# Patient Record
Sex: Female | Born: 1985 | Hispanic: Yes | Marital: Single | Smoking: Former smoker
Health system: Southern US, Community
[De-identification: ages and names within clinical notes are randomized; demographics above are authoritative.]

## PROBLEM LIST (undated history)

## (undated) ENCOUNTER — Inpatient Hospital Stay (HOSPITAL_COMMUNITY): Payer: Self-pay

## (undated) DIAGNOSIS — O24419 Gestational diabetes mellitus in pregnancy, unspecified control: Secondary | ICD-10-CM

## (undated) DIAGNOSIS — K219 Gastro-esophageal reflux disease without esophagitis: Secondary | ICD-10-CM

## (undated) HISTORY — DX: Gestational diabetes mellitus in pregnancy, unspecified control: O24.419

---
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
## 2004-12-16 ENCOUNTER — Ambulatory Visit (HOSPITAL_COMMUNITY): Admission: RE | Admit: 2004-12-16 | Discharge: 2004-12-16 | Payer: Self-pay | Admitting: *Deleted

## 2004-12-23 ENCOUNTER — Inpatient Hospital Stay (HOSPITAL_COMMUNITY): Admission: AD | Admit: 2004-12-23 | Discharge: 2004-12-23 | Payer: Self-pay | Admitting: *Deleted

## 2005-05-07 ENCOUNTER — Ambulatory Visit (HOSPITAL_COMMUNITY): Admission: RE | Admit: 2005-05-07 | Discharge: 2005-05-07 | Payer: Self-pay | Admitting: Family Medicine

## 2005-05-13 ENCOUNTER — Ambulatory Visit: Payer: Self-pay | Admitting: Obstetrics & Gynecology

## 2005-05-18 ENCOUNTER — Ambulatory Visit: Payer: Self-pay | Admitting: Obstetrics & Gynecology

## 2005-05-18 ENCOUNTER — Inpatient Hospital Stay (HOSPITAL_COMMUNITY): Admission: RE | Admit: 2005-05-18 | Discharge: 2005-05-21 | Payer: Self-pay | Admitting: Obstetrics & Gynecology

## 2005-05-24 ENCOUNTER — Inpatient Hospital Stay (HOSPITAL_COMMUNITY): Admission: AD | Admit: 2005-05-24 | Discharge: 2005-05-24 | Payer: Self-pay | Admitting: *Deleted

## 2011-09-22 ENCOUNTER — Encounter (HOSPITAL_COMMUNITY): Payer: Self-pay | Admitting: *Deleted

## 2011-09-22 ENCOUNTER — Inpatient Hospital Stay (HOSPITAL_COMMUNITY)
Admission: AD | Admit: 2011-09-22 | Discharge: 2011-09-22 | Disposition: A | Discharge status: Home / Self Care | Payer: Self-pay | Source: Ambulatory Visit | Attending: Obstetrics and Gynecology | Admitting: Obstetrics and Gynecology

## 2011-09-22 ENCOUNTER — Inpatient Hospital Stay (HOSPITAL_COMMUNITY): Payer: Self-pay

## 2011-09-22 DIAGNOSIS — O26899 Other specified pregnancy related conditions, unspecified trimester: Secondary | ICD-10-CM

## 2011-09-22 DIAGNOSIS — R109 Unspecified abdominal pain: Secondary | ICD-10-CM | POA: Insufficient documentation

## 2011-09-22 DIAGNOSIS — O99891 Other specified diseases and conditions complicating pregnancy: Secondary | ICD-10-CM | POA: Insufficient documentation

## 2011-09-22 LAB — CBC
HCT: 35.7 % — ABNORMAL LOW (ref 36.0–46.0)
Hemoglobin: 12.1 g/dL (ref 12.0–15.0)
MCH: 32.4 pg (ref 26.0–34.0)
MCHC: 33.9 g/dL (ref 30.0–36.0)
MCV: 95.5 fL (ref 78.0–100.0)

## 2011-09-22 LAB — WET PREP, GENITAL

## 2011-09-22 NOTE — MAU Note (Signed)
Patient states she has been having abdominal pain around the belly button for "some weeks" that comes goes. Feels like something is moving. Denies any bleeding with a little discharge. A lot of nausea for about one month.

## 2011-09-23 LAB — GC/CHLAMYDIA PROBE AMP, GENITAL: GC Probe Amp, Genital: NEGATIVE

## 2012-01-12 ENCOUNTER — Encounter: Payer: Self-pay | Admitting: Obstetrics and Gynecology

## 2012-01-18 LAB — OB RESULTS CONSOLE RPR: RPR: NONREACTIVE

## 2012-01-18 LAB — OB RESULTS CONSOLE HIV ANTIBODY (ROUTINE TESTING): HIV: NONREACTIVE

## 2012-01-18 LAB — SICKLE CELL SCREEN: Sickle Cell Screen: NORMAL

## 2012-01-18 LAB — OB RESULTS CONSOLE VARICELLA ZOSTER ANTIBODY, IGG: VARICELLA IGG: IMMUNE

## 2012-03-09 ENCOUNTER — Other Ambulatory Visit: Payer: Self-pay | Admitting: Obstetrics & Gynecology

## 2012-05-02 ENCOUNTER — Encounter (HOSPITAL_COMMUNITY): Payer: Self-pay

## 2012-05-03 ENCOUNTER — Encounter (HOSPITAL_COMMUNITY)
Admission: RE | Admit: 2012-05-03 | Discharge: 2012-05-03 | Disposition: A | Discharge status: Home / Self Care | Payer: Medicaid Other | Source: Ambulatory Visit | Attending: Obstetrics & Gynecology | Admitting: Obstetrics & Gynecology

## 2012-05-03 ENCOUNTER — Encounter (HOSPITAL_COMMUNITY): Payer: Self-pay

## 2012-05-03 LAB — CBC
Hemoglobin: 13.5 g/dL (ref 12.0–15.0)
MCH: 32.3 pg (ref 26.0–34.0)
MCV: 95 fL (ref 78.0–100.0)
RBC: 4.18 MIL/uL (ref 3.87–5.11)

## 2012-05-03 LAB — TYPE AND SCREEN: Antibody Screen: NEGATIVE

## 2012-05-03 LAB — ABO/RH: ABO/RH(D): O POS

## 2012-05-03 LAB — RPR: RPR Ser Ql: NONREACTIVE

## 2012-05-03 NOTE — Patient Instructions (Addendum)
05/06/1418 Kristina Campbell  05/03/2012   Your procedure is scheduled on:  05/05/12  Enter through the Main Entrance of [facility] at 8 AM.  Pick up the phone at the desk and dial [phone].   Call this number if you have problems the morning of surgery: [phone]   Remember:   Do not eat food:After Midnight.  Do not drink clear liquids: After Midnight.  Take these medicines the morning of surgery with A SIP OF WATER: NA   Do not wear jewelry, make-up or nail polish.  Do not wear lotions, powders, or perfumes. You may wear deodorant.  Do not shave 48 hours prior to surgery.  Do not bring valuables to the hospital.  Contacts, dentures or bridgework may not be worn into surgery.  Leave suitcase in the car. After surgery it may be brought to your room.  For patients admitted to the hospital, checkout time is 11:00 AM the day of discharge.   Patients discharged the day of surgery will not be allowed to drive home.  Name and phone number of your driver: NA  Special Instructions: Shower using CHG 2 nights before surgery and the night before surgery.  If you shower the day of surgery use CHG.  Use special wash - you have one bottle of CHG for all showers.  You should use approximately 1/3 of the bottle for each shower.   Please read over the following fact sheets that you were given: Surgical Site Infection Prevention            20 Kristina Campbell  05/03/2012   Your procedure is scheduled on:  05/05/12  Enter through the Main Entrance of [facility] at 8 AM.  Pick up the phone at the desk and dial [phone].   Call this number if you have problems the morning of surgery: [phone]   Remember:   Do not eat food:After Midnight.  Do not drink clear liquids: After Midnight.  Take these medicines the morning of surgery with A SIP OF WATER: NA   Do not wear jewelry, make-up or nail polish.  Do not wear lotions, powders, or perfumes. You may wear deodorant.  Do  not shave 48 hours prior to surgery.  Do not bring valuables to the hospital.  Contacts, dentures or bridgework may not be worn into surgery.  Leave suitcase in the car. After surgery it may be brought to your room.  For patients admitted to the hospital, checkout time is 11:00 AM the day of discharge.   Patients discharged the day of surgery will not be allowed to drive home.  Name and phone number of your driver: NA  Special Instructions: Shower using CHG 2 nights before surgery and the night before surgery.  If you shower the day of surgery use CHG.  Use special wash - you have one bottle of CHG for all showers.  You should use approximately 1/3 of the bottle for each shower.   Please read over the following fact sheets that you were given: Surgical Site Infection Prevention

## 2012-05-05 ENCOUNTER — Encounter (HOSPITAL_COMMUNITY): Payer: Self-pay

## 2012-05-05 ENCOUNTER — Inpatient Hospital Stay (HOSPITAL_COMMUNITY): Payer: Medicaid Other

## 2012-05-05 ENCOUNTER — Encounter (HOSPITAL_COMMUNITY)
Admission: RE | Disposition: A | Discharge status: Home / Self Care | Payer: Self-pay | Source: Ambulatory Visit | Attending: Obstetrics & Gynecology

## 2012-05-05 ENCOUNTER — Encounter (HOSPITAL_COMMUNITY): Payer: Self-pay | Admitting: General Surgery

## 2012-05-05 ENCOUNTER — Inpatient Hospital Stay (HOSPITAL_COMMUNITY)
Admission: RE | Admit: 2012-05-05 | Discharge: 2012-05-07 | DRG: 766 | Disposition: A | Discharge status: Home / Self Care | Payer: Medicaid Other | Source: Ambulatory Visit | Attending: Obstetrics & Gynecology | Admitting: Obstetrics & Gynecology

## 2012-05-05 DIAGNOSIS — O9989 Other specified diseases and conditions complicating pregnancy, childbirth and the puerperium: Secondary | ICD-10-CM

## 2012-05-05 DIAGNOSIS — Z01818 Encounter for other preprocedural examination: Secondary | ICD-10-CM

## 2012-05-05 DIAGNOSIS — Z98891 History of uterine scar from previous surgery: Secondary | ICD-10-CM

## 2012-05-05 DIAGNOSIS — O99892 Other specified diseases and conditions complicating childbirth: Secondary | ICD-10-CM | POA: Diagnosis present

## 2012-05-05 DIAGNOSIS — O34219 Maternal care for unspecified type scar from previous cesarean delivery: Principal | ICD-10-CM

## 2012-05-05 DIAGNOSIS — K219 Gastro-esophageal reflux disease without esophagitis: Secondary | ICD-10-CM

## 2012-05-05 DIAGNOSIS — Z01812 Encounter for preprocedural laboratory examination: Secondary | ICD-10-CM

## 2012-05-05 HISTORY — DX: Gastro-esophageal reflux disease without esophagitis: K21.9

## 2012-05-05 LAB — TYPE AND SCREEN

## 2012-05-05 SURGERY — Surgical Case
Anesthesia: Spinal | Site: Abdomen | Wound class: Clean Contaminated

## 2012-05-05 MED ORDER — NALOXONE HCL 0.4 MG/ML IJ SOLN: 0.4000 mg | INTRAMUSCULAR | Status: DC | PRN

## 2012-05-05 MED ORDER — ONDANSETRON HCL 4 MG/2ML IJ SOLN: 4.0000 mg | Freq: Three times a day (TID) | INTRAMUSCULAR | Status: DC | PRN

## 2012-05-05 MED ORDER — MEPERIDINE HCL 25 MG/ML IJ SOLN: 6.2500 mg | INTRAMUSCULAR | Status: DC | PRN

## 2012-05-05 MED ORDER — DIPHENHYDRAMINE HCL 50 MG/ML IJ SOLN: 12.5000 mg | INTRAMUSCULAR | Status: DC | PRN

## 2012-05-05 MED ORDER — ZOLPIDEM TARTRATE 5 MG PO TABS: 5.0000 mg | ORAL_TABLET | Freq: Every evening | ORAL | Status: DC | PRN

## 2012-05-05 MED ORDER — CEFAZOLIN SODIUM-DEXTROSE 2-3 GM-% IV SOLR
2.0000 g | INTRAVENOUS | Status: AC
  Administered 2012-05-05: 2 g via INTRAVENOUS

## 2012-05-05 MED ORDER — KETOROLAC TROMETHAMINE 30 MG/ML IJ SOLN: 30.0000 mg | Freq: Four times a day (QID) | INTRAMUSCULAR | Status: AC | PRN

## 2012-05-05 MED ORDER — IBUPROFEN 600 MG PO TABS
600.0000 mg | ORAL_TABLET | Freq: Four times a day (QID) | ORAL | Status: DC | PRN
  Filled 2012-05-05 (×3): qty 1

## 2012-05-05 MED ORDER — DIPHENHYDRAMINE HCL 25 MG PO CAPS
25.0000 mg | ORAL_CAPSULE | ORAL | Status: DC | PRN
  Filled 2012-05-05: qty 1

## 2012-05-05 MED ORDER — LACTATED RINGERS IV SOLN
INTRAVENOUS | Status: DC
  Administered 2012-05-05 (×3): via INTRAVENOUS

## 2012-05-05 MED ORDER — SIMETHICONE 80 MG PO CHEW
80.0000 mg | CHEWABLE_TABLET | Freq: Three times a day (TID) | ORAL | Status: DC
  Administered 2012-05-05 – 2012-05-07 (×5): 80 mg via ORAL

## 2012-05-05 MED ORDER — OXYTOCIN 10 UNIT/ML IJ SOLN
40.0000 [IU] | INTRAVENOUS | Status: DC | PRN
  Administered 2012-05-05: 40 [IU] via INTRAVENOUS

## 2012-05-05 MED ORDER — OXYTOCIN 40 UNITS IN LACTATED RINGERS INFUSION - SIMPLE MED: 62.5000 mL/h | INTRAVENOUS | Status: AC

## 2012-05-05 MED ORDER — ONDANSETRON HCL 4 MG/2ML IJ SOLN
INTRAMUSCULAR | Status: AC
  Filled 2012-05-05: qty 2

## 2012-05-05 MED ORDER — WITCH HAZEL-GLYCERIN EX PADS: 1.0000 "application " | MEDICATED_PAD | CUTANEOUS | Status: DC | PRN

## 2012-05-05 MED ORDER — SCOPOLAMINE 1 MG/3DAYS TD PT72: 1.0000 | MEDICATED_PATCH | Freq: Once | TRANSDERMAL | Status: DC

## 2012-05-05 MED ORDER — LACTATED RINGERS IV SOLN
INTRAVENOUS | Status: DC
  Administered 2012-05-05 – 2012-05-06 (×2): via INTRAVENOUS

## 2012-05-05 MED ORDER — DIPHENHYDRAMINE HCL 25 MG PO CAPS: 25.0000 mg | ORAL_CAPSULE | Freq: Four times a day (QID) | ORAL | Status: DC | PRN

## 2012-05-05 MED ORDER — MORPHINE SULFATE (PF) 0.5 MG/ML IJ SOLN
INTRAMUSCULAR | Status: DC | PRN
  Administered 2012-05-05: .15 mg via INTRATHECAL

## 2012-05-05 MED ORDER — IBUPROFEN 600 MG PO TABS
600.0000 mg | ORAL_TABLET | Freq: Four times a day (QID) | ORAL | Status: DC
  Administered 2012-05-06 – 2012-05-07 (×6): 600 mg via ORAL
  Filled 2012-05-05 (×2): qty 1

## 2012-05-05 MED ORDER — PHENYLEPHRINE HCL 10 MG/ML IJ SOLN
INTRAMUSCULAR | Status: DC | PRN
  Administered 2012-05-05: 40 ug via INTRAVENOUS
  Administered 2012-05-05 (×2): 80 ug via INTRAVENOUS
  Administered 2012-05-05: 40 ug via INTRAVENOUS
  Administered 2012-05-05: 80 ug via INTRAVENOUS
  Administered 2012-05-05 (×2): 40 ug via INTRAVENOUS

## 2012-05-05 MED ORDER — NALBUPHINE HCL 10 MG/ML IJ SOLN
5.0000 mg | INTRAMUSCULAR | Status: DC | PRN
  Filled 2012-05-05: qty 1

## 2012-05-05 MED ORDER — DIPHENHYDRAMINE HCL 50 MG/ML IJ SOLN
INTRAMUSCULAR | Status: AC
  Administered 2012-05-05: 12.5 mg via INTRAVENOUS
  Filled 2012-05-05: qty 1

## 2012-05-05 MED ORDER — NALOXONE HCL 1 MG/ML IJ SOLN
1.0000 ug/kg/h | INTRAVENOUS | Status: DC | PRN
  Filled 2012-05-05: qty 2

## 2012-05-05 MED ORDER — SODIUM CHLORIDE 0.9 % IJ SOLN: 3.0000 mL | INTRAMUSCULAR | Status: DC | PRN

## 2012-05-05 MED ORDER — SIMETHICONE 80 MG PO CHEW: 80.0000 mg | CHEWABLE_TABLET | ORAL | Status: DC | PRN

## 2012-05-05 MED ORDER — SENNOSIDES-DOCUSATE SODIUM 8.6-50 MG PO TABS
2.0000 | ORAL_TABLET | Freq: Every day | ORAL | Status: DC
  Administered 2012-05-05 – 2012-05-06 (×2): 2 via ORAL

## 2012-05-05 MED ORDER — SCOPOLAMINE 1 MG/3DAYS TD PT72
1.0000 | MEDICATED_PATCH | Freq: Once | TRANSDERMAL | Status: DC
  Filled 2012-05-05: qty 1

## 2012-05-05 MED ORDER — TETANUS-DIPHTH-ACELL PERTUSSIS 5-2.5-18.5 LF-MCG/0.5 IM SUSP
0.5000 mL | Freq: Once | INTRAMUSCULAR | Status: AC
  Administered 2012-05-06: 0.5 mL via INTRAMUSCULAR

## 2012-05-05 MED ORDER — KETOROLAC TROMETHAMINE 60 MG/2ML IM SOLN: 60.0000 mg | Freq: Once | INTRAMUSCULAR | Status: AC | PRN

## 2012-05-05 MED ORDER — ONDANSETRON HCL 4 MG PO TABS: 4.0000 mg | ORAL_TABLET | ORAL | Status: DC | PRN

## 2012-05-05 MED ORDER — FENTANYL CITRATE 0.05 MG/ML IJ SOLN
INTRAMUSCULAR | Status: AC
  Filled 2012-05-05: qty 2

## 2012-05-05 MED ORDER — PRENATAL MULTIVITAMIN CH
1.0000 | ORAL_TABLET | Freq: Every day | ORAL | Status: DC
  Administered 2012-05-06: 1 via ORAL
  Filled 2012-05-05: qty 1

## 2012-05-05 MED ORDER — METOCLOPRAMIDE HCL 5 MG/ML IJ SOLN: 10.0000 mg | Freq: Three times a day (TID) | INTRAMUSCULAR | Status: DC | PRN

## 2012-05-05 MED ORDER — 0.9 % SODIUM CHLORIDE (POUR BTL) OPTIME
TOPICAL | Status: DC | PRN
  Administered 2012-05-05: 1000 mL

## 2012-05-05 MED ORDER — PHENYLEPHRINE 40 MCG/ML (10ML) SYRINGE FOR IV PUSH (FOR BLOOD PRESSURE SUPPORT)
PREFILLED_SYRINGE | INTRAVENOUS | Status: AC
  Filled 2012-05-05: qty 5

## 2012-05-05 MED ORDER — MENTHOL 3 MG MT LOZG: 1.0000 | LOZENGE | OROMUCOSAL | Status: DC | PRN

## 2012-05-05 MED ORDER — LANOLIN HYDROUS EX OINT: 1.0000 "application " | TOPICAL_OINTMENT | CUTANEOUS | Status: DC | PRN

## 2012-05-05 MED ORDER — OXYCODONE-ACETAMINOPHEN 5-325 MG PO TABS
1.0000 | ORAL_TABLET | ORAL | Status: DC | PRN
  Administered 2012-05-06: 2 via ORAL
  Administered 2012-05-07: 1 via ORAL
  Filled 2012-05-05: qty 1
  Filled 2012-05-05: qty 2

## 2012-05-05 MED ORDER — LACTATED RINGERS IV SOLN: INTRAVENOUS | Status: DC

## 2012-05-05 MED ORDER — EPHEDRINE 5 MG/ML INJ
INTRAVENOUS | Status: AC
  Filled 2012-05-05: qty 10

## 2012-05-05 MED ORDER — BUPIVACAINE IN DEXTROSE 0.75-8.25 % IT SOLN
INTRATHECAL | Status: DC | PRN
  Administered 2012-05-05: 1.4 mL via INTRATHECAL

## 2012-05-05 MED ORDER — KETOROLAC TROMETHAMINE 60 MG/2ML IM SOLN
INTRAMUSCULAR | Status: AC
  Administered 2012-05-05: 60 mg via INTRAMUSCULAR
  Filled 2012-05-05: qty 2

## 2012-05-05 MED ORDER — MORPHINE SULFATE 0.5 MG/ML IJ SOLN
INTRAMUSCULAR | Status: AC
  Filled 2012-05-05: qty 10

## 2012-05-05 MED ORDER — FENTANYL CITRATE 0.05 MG/ML IJ SOLN
INTRAMUSCULAR | Status: DC | PRN
  Administered 2012-05-05: 25 ug via INTRATHECAL

## 2012-05-05 MED ORDER — ONDANSETRON HCL 4 MG/2ML IJ SOLN: 4.0000 mg | INTRAMUSCULAR | Status: DC | PRN

## 2012-05-05 MED ORDER — EPHEDRINE SULFATE 50 MG/ML IJ SOLN
INTRAMUSCULAR | Status: DC | PRN
  Administered 2012-05-05: 5 mg via INTRAVENOUS
  Administered 2012-05-05: 10 mg via INTRAVENOUS
  Administered 2012-05-05: 5 mg via INTRAVENOUS
  Administered 2012-05-05: 10 mg via INTRAVENOUS

## 2012-05-05 MED ORDER — NALBUPHINE HCL 10 MG/ML IJ SOLN
5.0000 mg | INTRAMUSCULAR | Status: DC | PRN
Start: 2012-05-05 — End: 2012-05-07
  Filled 2012-05-05: qty 1

## 2012-05-05 MED ORDER — SCOPOLAMINE 1 MG/3DAYS TD PT72
MEDICATED_PATCH | TRANSDERMAL | Status: AC
  Administered 2012-05-05: 1.5 mg via TRANSDERMAL
  Filled 2012-05-05: qty 1

## 2012-05-05 MED ORDER — ONDANSETRON HCL 4 MG/2ML IJ SOLN
INTRAMUSCULAR | Status: DC | PRN
  Administered 2012-05-05: 4 mg via INTRAVENOUS

## 2012-05-05 MED ORDER — DIBUCAINE 1 % RE OINT: 1.0000 "application " | TOPICAL_OINTMENT | RECTAL | Status: DC | PRN

## 2012-05-05 MED ORDER — MISOPROSTOL 200 MCG PO TABS
ORAL_TABLET | ORAL | Status: AC
  Filled 2012-05-05: qty 1

## 2012-05-05 MED ORDER — OXYTOCIN 10 UNIT/ML IJ SOLN
INTRAMUSCULAR | Status: AC
  Filled 2012-05-05: qty 4

## 2012-05-05 MED ORDER — MORPHINE SULFATE (PF) 0.5 MG/ML IJ SOLN: INTRAMUSCULAR | Status: DC | PRN

## 2012-05-05 MED ORDER — DIPHENHYDRAMINE HCL 50 MG/ML IJ SOLN: 25.0000 mg | INTRAMUSCULAR | Status: DC | PRN

## 2012-05-05 SURGICAL SUPPLY — 30 items
BARRIER ADHS 3X4 INTERCEED (GAUZE/BANDAGES/DRESSINGS) IMPLANT
BRR ADH 4X3 ABS CNTRL BYND (GAUZE/BANDAGES/DRESSINGS)
CLOTH BEACON ORANGE TIMEOUT ST (SAFETY) ×2 IMPLANT
DRAPE LG THREE QUARTER DISP (DRAPES) ×2 IMPLANT
DRSG OPSITE POSTOP 4X10 (GAUZE/BANDAGES/DRESSINGS) ×2 IMPLANT
DURAPREP 26ML APPLICATOR (WOUND CARE) ×2 IMPLANT
ELECT REM PT RETURN 9FT ADLT (ELECTROSURGICAL) ×2
ELECTRODE REM PT RTRN 9FT ADLT (ELECTROSURGICAL) ×1 IMPLANT
EXTRACTOR VACUUM KIWI (MISCELLANEOUS) IMPLANT
GLOVE BIO SURGEON STRL SZ 6.5 (GLOVE) ×3 IMPLANT
GLOVE BIOGEL PI IND STRL 7.0 (GLOVE) ×1 IMPLANT
GLOVE BIOGEL PI INDICATOR 7.0 (GLOVE) ×3
GOWN STRL REIN XL XLG (GOWN DISPOSABLE) ×6 IMPLANT
KIT ABG SYR 3ML LUER SLIP (SYRINGE) IMPLANT
NEEDLE HYPO 25X5/8 SAFETYGLIDE (NEEDLE) IMPLANT
NS IRRIG 1000ML POUR BTL (IV SOLUTION) ×2 IMPLANT
PACK C SECTION WH (CUSTOM PROCEDURE TRAY) ×2 IMPLANT
PAD OB MATERNITY 4.3X12.25 (PERSONAL CARE ITEMS) ×2 IMPLANT
RETAINER VISCERAL (MISCELLANEOUS) ×2 IMPLANT
RETRACTOR WND ALEXIS 25 LRG (MISCELLANEOUS) ×1 IMPLANT
RTRCTR WOUND ALEXIS 25CM LRG (MISCELLANEOUS) ×2
SLEEVE SCD COMPRESS KNEE LRG (MISCELLANEOUS) IMPLANT
SLEEVE SCD COMPRESS KNEE MED (MISCELLANEOUS) IMPLANT
SUT VIC AB 0 CT1 36 (SUTURE) ×14 IMPLANT
SUT VIC AB 2-0 CT1 27 (SUTURE) ×2
SUT VIC AB 2-0 CT1 TAPERPNT 27 (SUTURE) ×1 IMPLANT
SUT VIC AB 4-0 PS2 27 (SUTURE) ×2 IMPLANT
TOWEL OR 17X24 6PK STRL BLUE (TOWEL DISPOSABLE) ×6 IMPLANT
TRAY FOLEY CATH 14FR (SET/KITS/TRAYS/PACK) ×2 IMPLANT
WATER STERILE IRR 1000ML POUR (IV SOLUTION) ×1 IMPLANT

## 2012-05-05 NOTE — Anesthesia Procedure Notes (Signed)

## 2012-05-05 NOTE — Progress Notes (Signed)
UR chart review completed.  

## 2012-05-05 NOTE — H&P (Signed)
Kristina Campbell is a 27 y.o. female presenting for repeat cesarean section, 3 previous.. Maternal Medical History:  Reason for admission: Repeat cesarean section  Fetal activity: Perceived fetal activity is normal.   Last perceived fetal movement was within the past hour.    Prenatal complications: No bleeding or pre-eclampsia.   Prenatal Complications - Diabetes: none.    OB History   Grav Para Term Preterm Abortions TAB SAB Ect Mult Living   4 3 3  0 0 0 0 0 0 3     Past Medical History  Diagnosis Date  . Medical history non-contributory   . GERD (gastroesophageal reflux disease)     pt. states she has reflux and takes Zantac-last taken yesterday   Past Surgical History  Procedure Laterality Date  . Cesarean section    . Cesarean section      x3   Family History: family history is not on file. Social History:  reports that she has quit smoking. She does not have any smokeless tobacco history on file. She reports that she does not drink alcohol. Her drug history is not on file.   Prenatal Transfer Tool  Maternal Diabetes: No Genetic Screening: Declined Maternal Ultrasounds/Referrals: Normal Fetal Ultrasounds or other Referrals:  None Maternal Substance Abuse:  No Significant Maternal Medications:  None Significant Maternal Lab Results:  None Other Comments:  None  Review of Systems  Constitutional: Negative.   Respiratory: Negative for cough.   Gastrointestinal: Negative for vomiting.  Genitourinary: Negative for urgency.      Blood pressure 124/63, pulse 89, temperature 97.9 F (36.6 C), temperature source Oral, last menstrual period 08/06/2011. Maternal Exam:  Abdomen: Surgical scars: low transverse.   Fundal height is c/w dates.   Estimated fetal weight is 7 lb.   Fetal presentation: vertex  Introitus: not evaluated.   Cervix: not evaluated.   Physical Exam  Nursing note and vitals reviewed. Constitutional: She is oriented to person,  place, and time. She appears well-developed and well-nourished. No distress.  Neck: Normal range of motion.  Cardiovascular: Normal rate, regular rhythm and normal heart sounds.   Respiratory: Effort normal and breath sounds normal. No respiratory distress.  GI: Soft. There is no tenderness.  Musculoskeletal: She exhibits no edema and no tenderness.  Neurological: She is alert and oriented to person, place, and time.  Skin: Skin is warm and dry.  Psychiatric: She has a normal mood and affect. Her behavior is normal.    Prenatal labs: ABO, Rh: --/--/O POS (03/14 0810) Antibody: NEG (03/14 0810) Rubella: Immune (11/26 1048) RPR: NON REACTIVE (03/12 1050)  HBsAg: Negative (11/26 1048)  HIV: Non-reactive (11/26 1048)  GBS:    CBC    Component Value Date/Time   WBC 7.2 05/03/2012 1050   RBC 4.18 05/03/2012 1050   HGB 13.5 05/03/2012 1050   HCT 39.7 05/03/2012 1050   PLT 155 05/03/2012 1050   MCV 95.0 05/03/2012 1050   MCH 32.3 05/03/2012 1050   MCHC 34.0 05/03/2012 1050   RDW 14.2 05/03/2012 1050     Assessment/Plan: 3 previous cesarean sections, for repeat at 39 weeks. With interpreter patient was counseled and her questions were answered. The risks of surgery and anesthesia were discussed including pain, bleeding, infection, visceral organ damage. Consent signed.  Earlin Sweeden 05/05/2012, 9:23 AM

## 2012-05-05 NOTE — Anesthesia Postprocedure Evaluation (Signed)
  Anesthesia Post-op Note  Patient: Kristina Campbell  Procedure(s) Performed: Procedure(s) with comments: CESAREAN SECTION (N/A) - Repeat  Patient Location: Mother/Baby  Anesthesia Type:Spinal  Level of Consciousness: awake, alert  and oriented  Airway and Oxygen Therapy: Patient Spontanous Breathing  Post-op Pain: mild  Post-op Assessment: Patient's Cardiovascular Status Stable, Respiratory Function Stable, Patent Airway, No signs of Nausea or vomiting, Adequate PO intake and Pain level controlled  Post-op Vital Signs: stable  Complications: No apparent anesthesia complications

## 2012-05-05 NOTE — Transfer of Care (Signed)
Immediate Anesthesia Transfer of Care Note  Patient: Kristina Campbell  Procedure(s) Performed: Procedure(s) with comments: CESAREAN SECTION (N/A) - Repeat  Patient Location: PACU  Anesthesia Type:Spinal  Level of Consciousness: awake, alert  and oriented  Airway & Oxygen Therapy: Patient Spontanous Breathing  Post-op Assessment: Report given to PACU RN and Post -op Vital signs reviewed and stable  Post vital signs: Reviewed and stable  Complications: No apparent anesthesia complications

## 2012-05-05 NOTE — Anesthesia Postprocedure Evaluation (Signed)
  Anesthesia Post-op Note  Patient: Renie Ora Dominguez-Arteaga  Procedure(s) Performed: Procedure(s) with comments: CESAREAN SECTION (N/A) - Repeat  Patient Location: PACU  Anesthesia Type:Spinal  Level of Consciousness: awake, alert  and oriented  Airway and Oxygen Therapy: Patient Spontanous Breathing  Post-op Pain: none  Post-op Assessment: Post-op Vital signs reviewed, Patient's Cardiovascular Status Stable, Respiratory Function Stable, Patent Airway, No signs of Nausea or vomiting, Pain level controlled, No headache, No backache and No residual numbness  Post-op Vital Signs: Reviewed and stable  Complications: No apparent anesthesia complications

## 2012-05-05 NOTE — Anesthesia Preprocedure Evaluation (Signed)

## 2012-05-05 NOTE — Op Note (Signed)
Procedure: Repeat cesarean section Preoperative diagnosis: Intrauterine pregnancy [redacted] weeks gestation with 3 previous cesarean sections Postoperative diagnosis: Intrauterine pregnancy delivered, live born female infant Surgeon: Dr. Scheryl Darter Anesthesia: Spinal by Dr. Cristela Blue Estimated blood loss: 700 mL Specimen: None Drains: Foley catheter Counts: Correct Complications: None  Patient gave written consent for repeat cesarean section at [redacted] weeks gestation. She had 3 previous cesarean sections. Patient identification was confirmed and she was brought to the operating room and spinal anesthesia was induced. Placed in dorsal supine position left lateral tilt. Foley catheter was placed. Abdomen was sterilely prepped and draped. #10 blade was used to make a transverse Pfannenstiel incision site of her previous cesarean section scars. Incision was carried out the fascia. The fascia was scored and peritoneal cavity was entered at midline. The fascial incision was extended transversely with curved Mayo scissors. The peritoneal incision was extended vertically With Metzenbaum scissors. There were a few omental adhesions to the abdominal wall but no significant adhesions in the pelvis. The Alexis retractor was placed. Vesicouterine fold was incised transversely with Metzenbaum scissors and a bladder flap was created. Lower uterine segment was entered at midline with #10 blade. Incision was extended transversely. The bag of water was ruptured and meconium staining was noted. Fetal head was elevated and delivered mouth and nose were cleared with bulb suction. Infant was delivered atraumatically and infant was a liveborn female vigorous at birth. The cord was clamped and cut and infant was handed to nursery personnel. Placenta was removed and uterine cavity was explored. Patient received IV Pitocin. The uterine incision was closed with a running locking suture with 0 Vicryl. Imbricating layer followed with 0 Vicryl.  Hemostatic and suture was placed left-sided incision and good hemostasis was seen. Both adnexa appeared normal. The anterior peritoneum was closed with a running suture with 2-0 Vicryl. The fascia was closed with running suture with 0 Vicryl. The skin incision was irrigated good hemostasis was seen. Skin was closed with a running subcuticular suture with 4-0 Vicryl. Sterile dressing was applied. Patient tolerated procedure well without complications. She is brought in stable condition to PACU.   Dr. Scheryl Darter 05/05/2012 11:45 AM

## 2012-05-06 LAB — CBC
MCH: 31.7 pg (ref 26.0–34.0)
Platelets: 144 10*3/uL — ABNORMAL LOW (ref 150–400)
RBC: 3.88 MIL/uL (ref 3.87–5.11)
WBC: 8.7 10*3/uL (ref 4.0–10.5)

## 2012-05-06 NOTE — Progress Notes (Signed)
Subjective: Postpartum Day #1: Cesarean Delivery Patient reports tolerating PO and no problems voiding; ambulating without difficulty; desires OCPs for contraception; breast and bottle feeding  Objective: Vital signs in last 24 hours: Temp:  [97.4 F (36.3 C)-99.4 F (37.4 C)] 98.3 F (36.8 C) (03/15 0637) Pulse Rate:  [63-90] 86 (03/15 0644) Resp:  [16-22] 18 (03/15 0637) BP: (97-133)/(48-82) 109/55 mmHg (03/15 0644) SpO2:  [95 %-100 %] 96 % (03/15 0252) Weight:  [190 lb (86.183 kg)] 190 lb (86.183 kg) (03/14 1300)  Physical Exam:  General: alert, cooperative and mild distress Lochia: appropriate Uterine Fundus: firm Incision: healing well, no significant drainage; honeycomb dsg intact DVT Evaluation: No evidence of DVT seen on physical exam.   Recent Labs  05/03/12 1050  HGB 13.5  HCT 39.7    Assessment/Plan: Status post Cesarean section. Doing well postoperatively.  Continue current care. CBC not collected this morning- ordered and pending now.  Cam Hai 05/06/2012, 7:24 AM

## 2012-05-07 DIAGNOSIS — Z98891 History of uterine scar from previous surgery: Secondary | ICD-10-CM

## 2012-05-07 MED ORDER — OXYCODONE-ACETAMINOPHEN 5-325 MG PO TABS: 1.0000 | ORAL_TABLET | ORAL | Status: DC | PRN

## 2012-05-07 MED ORDER — IBUPROFEN 600 MG PO TABS: 600.0000 mg | ORAL_TABLET | Freq: Four times a day (QID) | ORAL | Status: DC | PRN

## 2012-05-07 MED ORDER — NORETHINDRONE 0.35 MG PO TABS: 1.0000 | ORAL_TABLET | Freq: Every day | ORAL | Status: DC

## 2012-05-07 NOTE — Discharge Summary (Signed)
Obstetric Discharge Summary Kristina Campbell is a 27 y.o. [id number] presenting at [redacted]w[redacted]d for repeat cesarean section. She had an uncomplicated c/section delivery and unremarkable postpartum course. She is breast and bottle feeding and plans OCPs for contracteption.  Reason for Admission: cesarean section Prenatal Procedures: none Intrapartum Procedures: cesarean: low cervical, transverse Postpartum Procedures: none Complications-Operative and Postpartum: none Hemoglobin  Date Value Range Status  05/06/2012 12.3  12.0 - 15.0 g/dL Final     HCT  Date Value Range Status  05/06/2012 37.4  36.0 - 46.0 % Final    Physical Exam:  General: alert, cooperative and no distress Lochia: appropriate Uterine Fundus: firm Incision: healing well, no significant drainage, no dehiscence, no significant erythema DVT Evaluation: No evidence of DVT seen on physical exam. Negative Homan's sign. No cords or calf tenderness. No significant calf/ankle edema.  Discharge Diagnoses: Term Pregnancy-delivered and s/p cesarean section  Discharge Information: Date: 05/07/2012 Activity: pelvic rest Diet: routine Medications: PNV, Ibuprofen and Percocet Condition: stable Instructions: refer to practice specific booklet Discharge to: home Follow-up Information   Follow up with [facility] [place] In 6 weeks. (For postpartum visit)    Contact information:   [address] [place] [address] [phone]      Newborn Data: Live born female  Birth Weight: 8 lb 14.2 oz (4030 g) APGAR: 9, 9  Home with mother.  Napoleon Form 05/07/2012, 7:03 AM

## 2012-05-08 ENCOUNTER — Encounter (HOSPITAL_COMMUNITY): Payer: Self-pay | Admitting: Obstetrics & Gynecology

## 2013-12-24 ENCOUNTER — Encounter (HOSPITAL_COMMUNITY): Payer: Self-pay | Admitting: Obstetrics & Gynecology

## 2015-07-18 LAB — CYTOLOGY - PAP: PAP SMEAR: NEGATIVE

## 2015-08-15 ENCOUNTER — Encounter (HOSPITAL_COMMUNITY): Payer: Self-pay | Admitting: *Deleted

## 2015-08-15 ENCOUNTER — Inpatient Hospital Stay (HOSPITAL_COMMUNITY)
Admission: AD | Admit: 2015-08-15 | Discharge: 2015-08-15 | Disposition: A | Discharge status: Home / Self Care | Payer: Self-pay | Source: Ambulatory Visit | Attending: Family Medicine | Admitting: Family Medicine

## 2015-08-15 ENCOUNTER — Inpatient Hospital Stay (HOSPITAL_COMMUNITY): Payer: Self-pay

## 2015-08-15 DIAGNOSIS — N760 Acute vaginitis: Secondary | ICD-10-CM

## 2015-08-15 DIAGNOSIS — K219 Gastro-esophageal reflux disease without esophagitis: Secondary | ICD-10-CM | POA: Insufficient documentation

## 2015-08-15 DIAGNOSIS — Z3A08 8 weeks gestation of pregnancy: Secondary | ICD-10-CM | POA: Insufficient documentation

## 2015-08-15 DIAGNOSIS — B9689 Other specified bacterial agents as the cause of diseases classified elsewhere: Secondary | ICD-10-CM | POA: Insufficient documentation

## 2015-08-15 DIAGNOSIS — N930 Postcoital and contact bleeding: Secondary | ICD-10-CM | POA: Insufficient documentation

## 2015-08-15 DIAGNOSIS — O99611 Diseases of the digestive system complicating pregnancy, first trimester: Secondary | ICD-10-CM | POA: Insufficient documentation

## 2015-08-15 DIAGNOSIS — O23591 Infection of other part of genital tract in pregnancy, first trimester: Secondary | ICD-10-CM | POA: Insufficient documentation

## 2015-08-15 DIAGNOSIS — Z3491 Encounter for supervision of normal pregnancy, unspecified, first trimester: Secondary | ICD-10-CM

## 2015-08-15 DIAGNOSIS — Z87891 Personal history of nicotine dependence: Secondary | ICD-10-CM | POA: Insufficient documentation

## 2015-08-15 LAB — URINALYSIS, ROUTINE W REFLEX MICROSCOPIC
BILIRUBIN URINE: NEGATIVE
Ketones, ur: NEGATIVE mg/dL
Leukocytes, UA: NEGATIVE
Nitrite: NEGATIVE
Protein, ur: NEGATIVE mg/dL
SPECIFIC GRAVITY, URINE: 1.01 (ref 1.005–1.030)
pH: 6 (ref 5.0–8.0)

## 2015-08-15 LAB — WET PREP, GENITAL
Sperm: NONE SEEN
TRICH WET PREP: NONE SEEN
YEAST WET PREP: NONE SEEN

## 2015-08-15 LAB — URINE MICROSCOPIC-ADD ON
BACTERIA UA: NONE SEEN
WBC, UA: NONE SEEN WBC/hpf (ref 0–5)

## 2015-08-15 LAB — CBC
HEMATOCRIT: 35.3 % — AB (ref 36.0–46.0)
HEMOGLOBIN: 12.4 g/dL (ref 12.0–15.0)
MCH: 32.8 pg (ref 26.0–34.0)
MCHC: 35.1 g/dL (ref 30.0–36.0)
MCV: 93.4 fL (ref 78.0–100.0)
Platelets: 234 10*3/uL (ref 150–400)
RBC: 3.78 MIL/uL — AB (ref 3.87–5.11)
RDW: 13.1 % (ref 11.5–15.5)
WBC: 6.7 10*3/uL (ref 4.0–10.5)

## 2015-08-15 LAB — HCG, QUANTITATIVE, PREGNANCY: HCG, BETA CHAIN, QUANT, S: 59902 m[IU]/mL — AB (ref <5)

## 2015-08-15 LAB — OB RESULTS CONSOLE HIV ANTIBODY (ROUTINE TESTING): HIV: NONREACTIVE

## 2015-08-15 LAB — POCT PREGNANCY, URINE: PREG TEST UR: POSITIVE — AB

## 2015-08-15 MED ORDER — METRONIDAZOLE 500 MG PO TABS
500.0000 mg | ORAL_TABLET | Freq: Two times a day (BID) | ORAL | Status: DC
Start: 2015-08-15 — End: 2015-11-03

## 2015-08-15 NOTE — MAU Note (Addendum)
Was seen at general medical clinic and was told by LMP she was about 6 wks. About 1 hour ago C/O small amount of bleeding. Describes as spotting when she wipes. Had intercourse this AM. States she was put on metformin (for pre diabetes) just prior to finding out she was pregnant. When she told them , she was advised to stop taking it.

## 2015-08-15 NOTE — Discharge Instructions (Signed)
Vaginosis bacteriana (Bacterial Vaginosis) La vaginosis bacteriana es una infeccin de la vagina. Se produce cuando una cantidad excesiva de ciertos grmenes (bacterias) crece en la vagina. Esta infeccin aumenta el riesgo de contraer otras infecciones de transmisin sexual. El tratamiento de esta infeccin puede ayudar a reducir el riesgo de otras infecciones, como:   Clamidia.  Bettey MareGonorrea.  VIH.  Herpes. CUIDADOS EN EL HOGAR  Tome los medicamentos tal como se lo indic su mdico.  Finalice la prescripcin completa, aunque comience a sentirse mejor.  Comunique a sus compaeros sexuales que sufre una infeccin. Deben consultar a su mdico para iniciar un tratamiento.  Durante el tratamiento:  Soil scientistvite mantener relaciones sexuales o use preservativos de Network engineerla forma correcta.  No se haga duchas vaginales.  No consuma alcohol a menos que el mdico lo autorice.  No amamante a menos que el mdico la autorice. SOLICITE AYUDA SI:  No mejora luego de 3 das de [place] Janettratamiento.  Observa una secrecin (prdida) de color gris ms abundante que proviene de la vagina.  Siente ms dolor que antes.  Tiene fiebre. ASEGRESE DE QUE:   Comprende estas instrucciones.  Controlar su afeccin.  Recibir ayuda de inmediato si no mejora o si empeora.   Esta informacin no tiene Theme park managercomo fin reemplazar el consejo del mdico. Asegrese de hacerle al mdico cualquier pregunta que tenga.   Document Released: 05/07/2008 Document Revised: 03/01/2014 Elsevier Interactive Patient Education 2016 [employer]. Reposo plvico  (Pelvic Rest) El reposo plvico se recomienda a las mujeres cuando:   La placenta cubre parcial o completamente la abertura del cuello del tero (placenta previa).  Hay sangrado entre la pared del tero y el saco amnitico en el primer trimestre (hemorragia subcorinica).  El cuello uterino comienza a abrirse sin iniciarse el trabajo de parto (cuello uterino incompetente,  insuficiencia cervical).  El Brookevilletrabajo de parto se inicia muy pronto (parto prematuro). INSTRUCCIONES PARA EL CUIDADO EN EL HOGAR   No tenga relaciones sexuales, estimulacin, ni orgasmos.  No use tampones, no se haga duchas vaginales ni coloque ningn objeto en la vagina.  No levante objetos que pesen ms de 10 libras (4,5 kg).  Evite las actividades extenuantes o tensionar los msculos de la pelvis. SOLICITE ATENCIN MDICA SI:   Tiene un sangrado vaginal durante el embarazo. Considrelo como una posible emergencia.  Siente clicos en la zona baja del estmago (ms fuertes que los clicos menstruales).  Nota flujo vaginal (acuoso, con moco o Ruthsangre).  Siente un dolor en la espalda leve y sordo.  Tiene contracciones regulares o endurecimiento del tero. SOLICITE ATENCIN MDICA DE INMEDIATO SI:  Observa sangrado vaginal y tiene placenta previa.    Esta informacin no tiene Theme park managercomo fin reemplazar el consejo del mdico. Asegrese de hacerle al mdico cualquier pregunta que tenga.   Document Released: 11/03/2011 Elsevier Interactive Patient Education 2016 [employer]. Hemorragia vaginal durante el embarazo (segundo trimestre) (Vaginal Bleeding During Pregnancy, Second Trimester)  Durante el Psychiatristembarazo, es comn tener una pequea hemorragia vaginal (manchas). A veces, la hemorragia es normal y no representa un problema, pero en algunas ocasiones es un sntoma de algo grave. Asegrese de decirle a su mdico de inmediato si tiene algn tipo de hemorragia vaginal. CUIDADOS EN EL HOGAR  Controle su afeccin para ver si hay cambios.  Siga las indicaciones de su mdico con respecto al Haddon Heightsgrado de actividad que Verdipuede tener.  Si debe hacer reposo en cama:  Es posible que deba quedarse en cama y levantarse nicamente para ir al  Tyson Densebao.  Quizs le permitan hacer algunas actividades.  Si es necesario, planifique que alguien la ayude.  Marcelino FreestoneEscriba:  La cantidad de toallas higinicas que Bots[place]ausa cada  da.  La frecuencia con la que se cambia las toallas higinicas.  Indique que tan empapados (saturados) estn.  No use tampones.  No se haga duchas vaginales.  No tenga relaciones sexuales ni orgasmos hasta que el mdico la autorice.  Si elimina tejido por la vagina, gurdelo para mostrrselo al American Expressmdico.  Tome los medicamentos solamente como se lo haya indicado el mdico.  No tome aspirina, ya que puede causar hemorragias.  No haga ejercicios, no levante objetos pesados ni haga ninguna actividad que exija mucha energa y esfuerzo, salvo que su mdico la autorice.  Concurra a todas las visitas de control como se lo haya indicado el mdico. SOLICITE AYUDA SI:   Tiene una hemorragia vaginal.  Tiene clicos.  Tiene dolores de [place] Lanesparto.  Tiene fiebre que no desaparece despus de Teacher, adult educationtomar medicamentos. SOLICITE AYUDA DE INMEDIATO SI:  Siente clicos muy intensos en la espalda o en el vientre (abdomen).  Siente contracciones.  Tiene escalofros.  Elimina cogulos grandes o tejido por la vagina.  Tiene ms hemorragia.  Se siente dbil o que va a desvanecerse.  Pierde el conocimiento (se desmaya).  Tiene una prdida importante o sale lquido a borbotones por la vagina. ASEGRESE DE QUE:  Comprende estas instrucciones.  Controlar su afeccin.  Recibir ayuda de inmediato si no mejora o si empeora.   Esta informacin no tiene Theme park managercomo fin reemplazar el consejo del mdico. Asegrese de hacerle al mdico cualquier pregunta que tenga.   Document Released: 06/25/2013 Elsevier Interactive Patient Education [employer]2016 Elsevier Inc.

## 2015-08-15 NOTE — MAU Provider Note (Signed)
History     CSN: [id number]  Arrival date and time: 08/15/15 1318   None     Chief Complaint  Patient presents with  . Vaginal Bleeding   HPI Kristina Campbell is 30 y.o. [id number] [id number] weeks presenting with vaginal bleeding with clot  on tissue this morning.  No further bleeding. Neg for abdominal pain.  Last intercourse this am. Had + HPT and confirmed [facility] 2 weeks ago  that she was [redacted] week gestation.  Told she was prediabetic, treated was just begun with Metformin.  When she had + UPT, they advised her to stop taking. Hx of 4 previous C-Sections, first reported because baby was big.  Glucosuria today--she had pancakes and syrup this am.   Past Medical History  Diagnosis Date  . GERD (gastroesophageal reflux disease)     pt. states she has reflux and takes Zantac-last taken yesterday    Past Surgical History  Procedure Laterality Date  . Cesarean section    . Cesarean section      x3  . Cesarean section N/A 05/05/2012    Procedure: CESAREAN SECTION;  Surgeon: Adam PhenixJames G Arnold, MD;  Location: [facility];  Service: Obstetrics;  Laterality: N/A;  Repeat    History reviewed. No pertinent family history.  Social History  Substance Use Topics  . Smoking status: Former Games developermoker  . Smokeless tobacco: None  . Alcohol Use: No    Allergies: No Known Allergies  Prescriptions prior to admission  Medication Sig Dispense Refill Last Dose  . ibuprofen (ADVIL,MOTRIN) 600 MG tablet Take 1 tablet (600 mg total) by mouth every 6 (six) hours as needed. (Patient not taking: Reported on 08/15/2015) 30 tablet 1     Review of Systems  Constitutional: Negative for fever and chills.  Gastrointestinal: Negative for nausea, vomiting and abdominal pain.  Genitourinary: Negative for dysuria, urgency, frequency and hematuria.       + vaginal bleeding with clot this am after intercourse.   Neurological: Negative for headaches.   Physical Exam   Blood pressure 119/67, pulse  92, temperature 98.4 F (36.9 C), temperature source Oral, resp. rate 16, height 5' 3.5" (1.613 m), weight 178 lb (80.74 kg), last menstrual period 06/19/2015, unknown if currently breastfeeding.  Physical Exam  Constitutional: She is oriented to person, place, and time. She appears well-developed and well-nourished. No distress.  HENT:  Head: Normocephalic.  Neck: Normal range of motion.  Cardiovascular: Normal rate.   Respiratory: Effort normal.  GI: Soft. She exhibits no distension and no mass. There is no tenderness. There is no rebound and no guarding.  Genitourinary: There is no rash, tenderness or lesion on the right labia. There is no rash, tenderness or lesion on the left labia. Uterus is enlarged (7- 8 week size). Uterus is not tender. Cervix exhibits no motion tenderness, no discharge and no friability. Right adnexum displays no mass, no tenderness and no fullness. Left adnexum displays no mass, no tenderness and no fullness. There is bleeding in the vagina. No erythema or tenderness in the vagina. No foreign body (scant pink seen on qtip.  Neg for active bleeding) around the vagina. No signs of injury around the vagina. No vaginal discharge found.  Neurological: She is alert and oriented to person, place, and time.  Skin: Skin is warm and dry.  Psychiatric: She has a normal mood and affect. Her behavior is normal.   Results for orders placed or performed during the hospital encounter of 08/15/15 (from  the past 24 hour(s))  Urinalysis, Routine w reflex microscopic ([facility])     Status: Abnormal   Collection Time: 08/15/15  1:24 PM  Result Value Ref Range   Color, Urine YELLOW YELLOW   APPearance CLEAR CLEAR   Specific Gravity, Urine 1.010 1.005 - 1.030   pH 6.0 5.0 - 8.0   Glucose, UA >1000 (A) NEGATIVE mg/dL   Hgb urine dipstick SMALL (A) NEGATIVE   Bilirubin Urine NEGATIVE NEGATIVE   Ketones, ur NEGATIVE NEGATIVE mg/dL   Protein, ur NEGATIVE NEGATIVE mg/dL   Nitrite  NEGATIVE NEGATIVE   Leukocytes, UA NEGATIVE NEGATIVE  Urine microscopic-add on     Status: Abnormal   Collection Time: 08/15/15  1:24 PM  Result Value Ref Range   Squamous Epithelial / LPF 0-5 (A) NONE SEEN   WBC, UA NONE SEEN 0 - 5 WBC/hpf   RBC / HPF 0-5 0 - 5 RBC/hpf   Bacteria, UA NONE SEEN NONE SEEN  Pregnancy, urine POC     Status: Abnormal   Collection Time: 08/15/15  1:36 PM  Result Value Ref Range   Preg Test, Ur POSITIVE (A) NEGATIVE  Wet prep, genital     Status: Abnormal   Collection Time: 08/15/15  2:24 PM  Result Value Ref Range   Yeast Wet Prep HPF POC NONE SEEN NONE SEEN   Trich, Wet Prep NONE SEEN NONE SEEN   Clue Cells Wet Prep HPF POC PRESENT (A) NONE SEEN   WBC, Wet Prep HPF POC FEW (A) NONE SEEN   Sperm NONE SEEN   CBC     Status: Abnormal   Collection Time: 08/15/15  2:25 PM  Result Value Ref Range   WBC 6.7 4.0 - 10.5 K/uL   RBC 3.78 (L) 3.87 - 5.11 MIL/uL   Hemoglobin 12.4 12.0 - 15.0 g/dL   HCT 47.835.3 (L) 29.536.0 - 62.146.0 %   MCV 93.4 78.0 - 100.0 fL   MCH 32.8 26.0 - 34.0 pg   MCHC 35.1 30.0 - 36.0 g/dL   RDW 30.813.1 65.711.5 - 84.615.5 %   Platelets 234 150 - 400 K/uL  hCG, quantitative, pregnancy     Status: Abnormal   Collection Time: 08/15/15  2:25 PM  Result Value Ref Range   hCG, Beta Chain, Quant, S 59902 (H) <5 mIU/mL   Blood type per previous record O Positive  [place] Ob Comp Less 14 Wks  08/15/2015  CLINICAL DATA:  Patient with vaginal bleeding. Early pregnancy. Quantitative beta HCG level is 59,902. Patient is 8 weeks and 1 day pregnant based on her last menstrual period. EXAM: OBSTETRIC <14 WK ULTRASOUND TECHNIQUE: Transabdominal ultrasound was performed for evaluation of the gestation as well as the maternal uterus and adnexal regions. COMPARISON:  None. FINDINGS: Intrauterine gestational sac: Yes Yolk sac:  Yes Embryo:  Yes Cardiac Activity: Yes Heart Rate: 130 bpm CRL:   8  mm   6 w 5 d                  US EDC: 04/05/2015 Subchorionic hemorrhage:  None  visualized. Maternal uterus/adnexae: No uterine masses. Unremarkable ovaries. No adnexal masses. No free fluid. IMPRESSION: 1. Single live anterior pregnancy with a measured gestational age of [redacted] weeks and 5 days. No emergent pregnancy complication or maternal abnormality. Electronically Signed   By: Amie Portlandavid  Ormond M.D.   On: 08/15/2015 15:46    MAU Course  Procedures  GC/CHL and HIV pending.  MDM MSE LAbs Exam  U/S  Assessment and Plan  A:  Post coital bleeding in first trimester pregnancy      Viable IUP [redacted]w[redacted]d gestation by U/S ([redacted]w[redacted]d by LMP)      Bacterial Vaginosis  P: Stressed importance of eating a healthier diet.      Rx for Flagyl to pharmacy     Begin prenatal vitamins daily     Call to schedule appt for prenatal care.  They plan care at [facility].      Pelvic rest until bleeding stops

## 2015-08-16 LAB — HIV ANTIBODY (ROUTINE TESTING W REFLEX): HIV Screen 4th Generation wRfx: NONREACTIVE

## 2015-08-18 LAB — GC/CHLAMYDIA PROBE AMP (~~LOC~~) NOT AT ARMC
CHLAMYDIA, DNA PROBE: NEGATIVE
Neisseria Gonorrhea: NEGATIVE

## 2015-10-16 ENCOUNTER — Other Ambulatory Visit (HOSPITAL_COMMUNITY): Payer: Self-pay | Admitting: Nurse Practitioner

## 2015-10-16 DIAGNOSIS — Z3A18 18 weeks gestation of pregnancy: Secondary | ICD-10-CM

## 2015-10-16 DIAGNOSIS — Z3689 Encounter for other specified antenatal screening: Secondary | ICD-10-CM

## 2015-10-16 DIAGNOSIS — O24419 Gestational diabetes mellitus in pregnancy, unspecified control: Secondary | ICD-10-CM

## 2015-10-16 LAB — OB RESULTS CONSOLE RUBELLA ANTIBODY, IGM: RUBELLA: IMMUNE

## 2015-10-16 LAB — OB RESULTS CONSOLE RPR: RPR: NONREACTIVE

## 2015-10-16 LAB — OB RESULTS CONSOLE HEPATITIS B SURFACE ANTIGEN: Hepatitis B Surface Ag: NEGATIVE

## 2015-10-16 LAB — OB RESULTS CONSOLE GC/CHLAMYDIA
CHLAMYDIA, DNA PROBE: NEGATIVE
GC PROBE AMP, GENITAL: NEGATIVE

## 2015-10-16 LAB — GLUCOSE TOLERANCE, 1 HOUR: Glucose 1 Hour: 202

## 2015-10-16 LAB — CYSTIC FIBROSIS DIAGNOSTIC STUDY: Interpretation-CFDNA:: NEGATIVE

## 2015-10-28 ENCOUNTER — Encounter (HOSPITAL_COMMUNITY): Payer: Self-pay | Admitting: Nurse Practitioner

## 2015-11-03 ENCOUNTER — Encounter: Payer: Self-pay | Admitting: Obstetrics & Gynecology

## 2015-11-03 ENCOUNTER — Ambulatory Visit (INDEPENDENT_AMBULATORY_CARE_PROVIDER_SITE_OTHER): Payer: Medicaid Other | Admitting: Obstetrics & Gynecology

## 2015-11-03 VITALS — BP 112/76 | HR 89 | Wt 183.0 lb

## 2015-11-03 DIAGNOSIS — O0992 Supervision of high risk pregnancy, unspecified, second trimester: Secondary | ICD-10-CM

## 2015-11-03 DIAGNOSIS — O0990 Supervision of high risk pregnancy, unspecified, unspecified trimester: Secondary | ICD-10-CM | POA: Insufficient documentation

## 2015-11-03 DIAGNOSIS — O24419 Gestational diabetes mellitus in pregnancy, unspecified control: Secondary | ICD-10-CM | POA: Insufficient documentation

## 2015-11-03 DIAGNOSIS — Z23 Encounter for immunization: Secondary | ICD-10-CM

## 2015-11-03 LAB — POCT URINALYSIS DIP (DEVICE)
Bilirubin Urine: NEGATIVE
GLUCOSE, UA: 500 mg/dL — AB
KETONES UR: NEGATIVE mg/dL
LEUKOCYTES UA: NEGATIVE
Nitrite: NEGATIVE
Protein, ur: NEGATIVE mg/dL
SPECIFIC GRAVITY, URINE: 1.01 (ref 1.005–1.030)
UROBILINOGEN UA: 0.2 mg/dL (ref 0.0–1.0)
pH: 6 (ref 5.0–8.0)

## 2015-11-03 LAB — COMPREHENSIVE METABOLIC PANEL
ALBUMIN: 3.8 g/dL (ref 3.6–5.1)
ALT: 16 U/L (ref 6–29)
AST: 21 U/L (ref 10–30)
Alkaline Phosphatase: 51 U/L (ref 33–115)
BUN: 8 mg/dL (ref 7–25)
CHLORIDE: 102 mmol/L (ref 98–110)
CO2: 26 mmol/L (ref 20–31)
Calcium: 9.6 mg/dL (ref 8.6–10.2)
Creat: 0.49 mg/dL — ABNORMAL LOW (ref 0.50–1.10)
GLUCOSE: 89 mg/dL (ref 65–99)
Potassium: 3.9 mmol/L (ref 3.5–5.3)
SODIUM: 137 mmol/L (ref 135–146)
TOTAL PROTEIN: 6.6 g/dL (ref 6.1–8.1)
Total Bilirubin: 0.3 mg/dL (ref 0.2–1.2)

## 2015-11-03 LAB — CBC
HCT: 34.4 % — ABNORMAL LOW (ref 35.0–45.0)
HEMOGLOBIN: 11.8 g/dL (ref 11.7–15.5)
MCH: 32.6 pg (ref 27.0–33.0)
MCHC: 34.3 g/dL (ref 32.0–36.0)
MCV: 95 fL (ref 80.0–100.0)
MPV: 10 fL (ref 7.5–12.5)
PLATELETS: 250 10*3/uL (ref 140–400)
RBC: 3.62 MIL/uL — AB (ref 3.80–5.10)
RDW: 13.3 % (ref 11.0–15.0)
WBC: 8 10*3/uL (ref 3.8–10.8)

## 2015-11-03 MED ORDER — ASPIRIN EC 81 MG PO TBEC: 81.0000 mg | DELAYED_RELEASE_TABLET | Freq: Every day | ORAL | 2 refills | Status: DC

## 2015-11-03 NOTE — Progress Notes (Signed)
PRENATAL VISIT NOTE  Subjective:  Kristina Campbell is a 30 y.o. G5P4004 at [redacted]w[redacted]d being seen today for transfer of prenatal care from [facility] for diagnosis of DM at [redacted] weeks GA. No problems so far this pregnancy.    She is currently monitored for the following issues for this high-risk pregnancy and has Previous cesarean section x 4 complicating pregnancy; Supervision of high-risk pregnancy; and A2/B Gestational diabetes mellitus, antepartum on her problem list.  Patient is Spanish-speaking only, Spanish interpreter present for this encounter. Patient reports no complaints.  Contractions: Not present. Vag. Bleeding: None.  Movement: Present. Denies leaking of fluid.   Past Medical History:  Diagnosis Date  . GERD (gastroesophageal reflux disease)    pt. states she has reflux and takes Zantac-last taken yesterday   Past Surgical History:  Procedure Laterality Date  . CESAREAN SECTION    . CESAREAN SECTION     x3  . CESAREAN SECTION N/A 05/05/2012   Procedure: CESAREAN SECTION;  Surgeon: Adam Phenix, MD;  Location: [facility];  Service: Obstetrics;  Laterality: N/A;  Repeat  . CESAREAN SECTION     Obstetric History   G5   P4   T4   P0   A0   L4    SAB0   TAB0   Ectopic0   Multiple0   Live Births4     # Outcome Date GA Lbr Len/2nd Weight Sex Delivery Anes PTL Lv  5 Current           4 Term 05/05/12 [redacted]w[redacted]d  8 lb 14.2 oz (4.03 kg) M CS-LTranv Spinal  LIV     Name: BRYNNLEY, DAYRIT     Apgar1:  9                Apgar5: 9  3 Term 05/18/05 [redacted]w[redacted]d  7 lb (3.175 kg) F CS-LTranv Spinal N LIV  2 Term 02/20/02 [redacted]w[redacted]d  9 lb (4.082 kg) M CS-LTranv Spinal N LIV  1 Term 11/16/00 [redacted]w[redacted]d  10 lb (4.536 kg) M CS-LTranv Spinal  LIV     The following portions of the patient's history were reviewed and updated as appropriate: allergies, current medications, past family history, past medical history, past social history, past surgical history and problem list. Problem list  updated.  Objective:   Vitals:   11/03/15 1325  BP: 112/76  Pulse: 89  Weight: 183 lb (83 kg)    Fetal Status: Fetal Heart Rate (bpm): 155 Fundal Height: 18 cm Movement: Present     General:  Alert, oriented and cooperative. Patient is in no acute distress.  Skin: Skin is warm and dry. No rash noted.   Cardiovascular: Normal heart rate noted  Respiratory: Normal respiratory effort, no problems with respiration noted  Abdomen: Soft, gravid, appropriate for gestational age. Pain/Pressure: Present     Pelvic:  Cervical exam deferred        Extremities: Normal range of motion.  Edema: Trace  Mental Status: Normal mood and affect. Normal behavior. Normal judgment and thought content.   Urinalysis: Urine Protein: Negative Urine Glucose: Negative  Assessment and Plan:  Pregnancy: G5P4004 at [redacted]w[redacted]d  1. A2/B Gestational diabetes mellitus, antepartum [X]  Baseline labs (CBC, CMP, urine Pr:Cr,TSH, HgA1C) - drawn today [X]  Aspirin 81 mg daily prescribed after 12 weeks [X]  Fetal ECHO, appt made today [X]  Eye exam, appt made today [ ]  Serial growth scans 20-24-28-32-35-38. Anatomy scan scheduled on 11/05/15 [ ]  Antenatal testing starting at 32 weeks [ ]  Delivery by  39 weeks or earlier if needed Discussed implications of DM in pregnancy, need for optimizing glycemic control to decrease DM associated maternal-fetal morbidity and mortality, need for antenatal testing and frequent ultrasounds/prenatal visits.  - CBC - Comprehensive metabolic panel - Hemoglobin A1c - Protein / creatinine ratio, urine - TSH - Ambulatory referral to Ophthalmology - US Fetal Echocardiography; Future - aspirin EC 81 MG tablet; Take 1 tablet (81 mg total) by mouth daily. Take after 12 weeks for prevention of preeclampsia later in pregnancy  Dispense: 300 tablet; Refill: 2 Will return for DM education and to get supplies for testing soon.  2. Flu vaccine need - Flu Vaccine QUAD 36+ mos IM (Fluarix & Fluzone Quad  PF  3. Supervision of high-risk pregnancy, second trimester No other complaints or concerns.  Routine obstetric precautions reviewed. The nature of [facility] with multiple MDs and other Advanced Practice Providers was explained to patient; also emphasized that residents, students are part of our team. Please refer to After Visit Summary for other counseling recommendations.  Return in about 1 week (around 11/10/2015) for DM education only; please schedule in am.  Tereso NewcomerUgonna A Abundio Teuscher, MD

## 2015-11-03 NOTE — Progress Notes (Signed)
Video Interpreter [phone] New ob packet given  Flu vaccine today  [facility]etal [facility] 11/18/15 @ 1000.  [facility] to give a return call back to schedule referral appt. Pt notified of appts and to bring money to the appt due to her being self pay.

## 2015-11-03 NOTE — Patient Instructions (Signed)
Diabetes mellitus gestacional (Gestational Diabetes Mellitus) La diabetes mellitus gestacional, ms comnmente conocida como diabetes gestacional es un tipo de diabetes que desarrollan algunas mujeres durante el embarazo. En la diabetes gestacional, el pncreas no produce suficiente insulina (una hormona) o las clulas son menos sensibles a la insulina producida (resistencia a la insulina), o ambas cosas. Normalmente, la insulina mueve los azcares de los alimentos a las clulas de los tejidos. Las clulas de los tejidos utilizan los azcares para obtener energa. La falta de insulina o la falta de una respuesta normal a la insulina hace que el exceso de azcar se acumule en la sangre en lugar de penetrar en las clulas de los tejidos. Como resultado, se producen niveles altos de azcar en la sangre (hiperglucemia). El efecto de los niveles altos de azcar (glucosa) puede causar muchos problemas.  FACTORES DE RIESGO Usted tiene mayor probabilidad de desarrollar diabetes gestacional si tiene antecedentes familiares de diabetes y tambin si tiene uno o ms de los siguientes factores de riesgo:  ndice de masa corporal superior a 30 (obesidad).  Embarazo previo con diabetes gestacional.  La edad avanzada en el momento del embarazo. Si se mantienen los niveles de glucosa en la sangre en un rango normal durante el embarazo, las mujeres pueden tener un embarazo saludable. Si los niveles de glucosa en la sangre no estn bien controlados, puede haber riesgos para usted, el feto o el recin nacido, o durante el trabajo de parto y el parto.  SNTOMAS  Si se presentan sntomas, stos son similares a los sntomas que normalmente experimentar durante el embarazo. Los sntomas de la diabetes gestacional son:   Aumento de la sed (polidipsia).  Aumento de la miccin (poliuria).  Orina con ms frecuencia durante la noche (nocturia).  Prdida de peso. La prdida de peso puede ser muy rpida.  Infecciones  frecuentes y recurrentes.  Cansancio (fatiga).  Debilidad.  Cambios en la visin, como visin borrosa.  Olor a fruta en el aliento.  Dolor abdominal. DIAGNSTICO La diabetes se diagnostica cuando hay aumento de los niveles de glucosa en la sangre. El nivel de glucosa en la sangre puede controlarse en uno o ms de los siguientes anlisis de sangre:  Medicin de glucosa en la sangre en ayunas. No se le permitir comer durante al menos 8 horas antes de que se tome una muestra de sangre.  Pruebas al azar de glucosa en la sangre. El nivel de glucosa en la sangre se controla en cualquier momento del da sin importar el momento en que haya comido.  Prueba de tolerancia a la glucosa oral (PTGO). La glucosa en la sangre se mide despus de no haber comido (ayunas) durante una a tres horas y despus de beber una bebida que contenga glucosa. Dado que las hormonas que causan la resistencia a la insulina son ms altas alrededor de las semanas 24 a 28 de embarazo, generalmente se realiza una PTGO durante ese tiempo. Si tiene factores de riesgo, en la primera visita prenatal pueden hacerle pruebas de deteccin de diabetes tipo 2 no diagnosticada. TRATAMIENTO  La diabetes gestacional debe controlarse en primer lugar con dieta y ejercicios. Pueden agregarse medicamentos, pero solo si son necesarios.  Usted tendr que tomar medicamentos para la diabetes o insulina diariamente para mantener los niveles de glucosa en la sangre en el rango deseado.  Usted tendr que combinar la dosis de insulina con la actividad fsica y la eleccin de alimentos saludables. Si tiene diabetes gestacional, el objetivo del tratamiento ser   mantener los siguientes niveles sanguneos de glucosa:  Antes de las comidas (preprandial): valor de 95 mg/dl o inferior.  Despus de las comidas (posprandial):  Una hora despus de la comida: valor de 140 mg/dl o inferior.  Dos horas despus de la comida: valor de 120 mg/dl o  inferior. Si tiene diabetes tipo 1 o tipo 2 preexistente, el objetivo del tratamiento ser mantener los siguientes niveles sanguneos de glucosa:  Antes de las comidas, a la hora de acostarse y durante la noche: de 60 a 99 mg/dl.  Despus de las comidas: valor mximo de 100 a 129 mg/dl. INSTRUCCIONES PARA EL CUIDADO EN EL HOGAR   Controle su nivel de hemoglobina A1c dos veces al ao.  Contrlese a diario el nivel de glucosa en la sangre segn las indicaciones de su mdico. Es comn realizar controles frecuentes de la glucosa en la sangre.  Supervise las cetonas en la orina cuando est enferma y segn las indicaciones de su mdico.  Tome el medicamento para la diabetes y adminstrese insulina segn las indicaciones de su mdico para mantener el nivel de glucosa en la sangre en el rango deseado.  Nunca se quede sin medicamento para la diabetes o sin insulina. Es necesario que la reciba todos los das.  Ajuste la insulina segn la ingesta de hidratos de carbono. Los hidratos de carbono pueden aumentar los niveles de glucosa en la sangre, pero deben incluirse en su dieta. Los hidratos de carbono aportan vitaminas, minerales y fibra que son una parte esencial de una dieta saludable. Los hidratos de carbono se encuentran en frutas, verduras, cereales integrales, productos lcteos, legumbres y alimentos que contienen azcares aadidos.  Consuma alimentos saludables. Alterne 3 comidas con 3 colaciones.  Aumente de peso saludablemente. El aumento del peso total vara de acuerdo con el ndice de masa corporal que tena antes del embarazo ([facility]).  Lleve una tarjeta de alerta mdica o use una pulsera o medalla de alerta mdica.  Lleve con usted una colacin de 15gramos de hidratos de carbono en todo momento para controlar los niveles bajos de glucosa en la sangre (hipoglucemia). Algunos ejemplos de colaciones de 15gramos de hidratos de carbono son los siguientes:  Tabletas de glucosa, 3 o 4.  Gel  de glucosa, tubo de 15 gramos.  Pasas de uva, 2 cucharadas (24 g).  Caramelos de goma, 6.  Galletas de animales, 8.  Jugo de fruta, gaseosa comn, o leche descremada, 4 onzas (120 ml).  Pastillas de goma, 9.  Reconocer la hipoglucemia. Durante el embarazo la hipoglucemia se produce cuando hay niveles de glucosa en la sangre de 60 mg/dl o menos. El riesgo de hipoglucemia aumenta durante el ayuno o cuando se saltea las comidas, durante o despus de realizar ejercicio intenso y mientras duerme. Los sntomas de hipoglucemia son:  Temblores o sacudidas.  Disminucin de la capacidad de concentracin.  Sudoracin.  Aumento de la frecuencia cardaca.  Dolor de cabeza.  Sequedad en la boca.  Hambre.  Irritabilidad.  Ansiedad.  Sueo agitado.  Alteracin del habla o de la coordinacin.  Confusin.  Tratar la hipoglucemia rpidamente. Si usted est alerta y puede tragar con seguridad, siga la regla de 15/15 que consiste en:  Tome entre 15 y 20gramos de glucosa de accin rpida o carbohidratos. Las opciones de accin rpida son un gel de glucosa, tabletas de glucosa, o 4 onzas (120 ml) de jugo de frutas, gaseosa comn, o leche baja en grasa.  Compruebe su nivel de glucosa en la sangre   15 minutos despus de tomar la glucosa.  Tome entre 15 y 20 gramos ms de glucosa si el nivel de glucosa en la sangre todava es de 70mg/dl o inferior.  Ingiera una comida o una colacin en el lapso de 1 hora una vez que los niveles de glucosa en la sangre vuelven a la normalidad.  Est atento a la poliuria (miccin excesiva) y la polidipsia (sensacin de mucha sed), que son los primeros signos de la hiperglucemia. El reconocimiento temprano de la hiperglucemia permite un tratamiento oportuno. Trate la hiperglucemia segn le indic su mdico.  Haga actividad fsica por lo menos 30minutos al da o como lo indique su mdico. Se recomienda que 30 minutos despus de cada comida, realice diez minutos  de actividad fsica para controlar los niveles de glucosa postprandial en la sangre.  Ajuste su dosis de insulina y la ingesta de alimentos, segn sea necesario, si inicia un nuevo ejercicio o deporte.  Siga su plan para los das de enfermedad cuando no pueda comer o beber como de costumbre.  Evite el tabaco y el alcohol.  Concurra a todas las visitas de control como se lo haya indicado el mdico.  Siga el consejo del mdico respecto a los controles prenatales y posteriores al parto (postparto), las visitas, la planificacin de las comidas, el ejercicio, los medicamentos, las vitaminas, los anlisis de sangre, otras pruebas mdicas y actividades fsicas.  Realice diariamente el cuidado de la piel y de los pies. Examine su piel y los pies diariamente para ver si tiene cortes, moretones, enrojecimiento, problemas en las uas, sangrado, ampollas o llagas.  Cepllese los dientes y encas por lo menos dos veces al da y use hilo dental al menos una vez por da. Concurra regularmente a las visitas de control con el dentista.  Programe un examen de vista durante el primer trimestre de su embarazo o como lo indique su mdico.  Comparta su plan de control de diabetes en el trabajo o en la escuela.  Mantngase al da con las vacunas.  Aprenda a manejar el estrs.  Obtenga la mayor cantidad posible de informacin sobre la diabetes y solicite ayuda siempre que sea necesario.  Obtenga informacin sobre el amamantamiento y analice esta posibilidad.  Debe controlar el nivel de azcar en la sangre de 6a 12semanas despus del parto. Esto se hace con una prueba de tolerancia a la glucosa oral (PTGO). SOLICITE ATENCIN MDICA SI:   No puede comer alimentos o beber por ms de 6 horas.  Tuvo nuseas o ha vomitado durante ms de 6 horas.  Tiene un nivel de glucosa en la sangre de 200 mg/dl y cetonas en la orina.  Presenta algn cambio en el estado mental.  Desarrolla problemas de visin.  Sufre  un dolor persistente de cabeza.  Siente dolor o molestias en la parte superior del abdomen.  Desarrolla una enfermedad grave adicional.  Tuvo diarrea durante ms de 6 horas.  Ha estado enfermo o ha tenido fiebre durante un par de das y no mejora. SOLICITE ATENCIN MDICA DE INMEDIATO SI:   Tiene dificultad para respirar.  Ya no siente los movimientos del beb.  Est sangrando o tiene flujo vaginal.  Comienza a tener contracciones o trabajo de parto prematuro. ASEGRESE DE QUE:  Comprende estas instrucciones.  Controlar su afeccin.  Recibir ayuda de inmediato si no mejora o si empeora.   Esta informacin no tiene como fin reemplazar el consejo del mdico. Asegrese de hacerle al mdico cualquier pregunta que tenga.     Document Released: 11/18/2004 Document Revised: 03/01/2014 Elsevier Interactive Patient Education 2016 [employer]  

## 2015-11-04 ENCOUNTER — Encounter (HOSPITAL_COMMUNITY): Payer: Self-pay

## 2015-11-04 LAB — PROTEIN / CREATININE RATIO, URINE: CREATININE, URINE: 39 mg/dL (ref 20–320)

## 2015-11-04 LAB — HEMOGLOBIN A1C
Hgb A1c MFr Bld: 5.8 % — ABNORMAL HIGH (ref <5.7)
Mean Plasma Glucose: 120 mg/dL

## 2015-11-04 LAB — TSH: TSH: 0.54 mIU/L

## 2015-11-05 ENCOUNTER — Encounter (HOSPITAL_COMMUNITY): Payer: Self-pay

## 2015-11-05 ENCOUNTER — Ambulatory Visit (HOSPITAL_COMMUNITY)
Admission: RE | Admit: 2015-11-05 | Discharge: 2015-11-05 | Disposition: A | Discharge status: Home / Self Care | Payer: Medicaid Other | Source: Ambulatory Visit | Attending: Nurse Practitioner | Admitting: Nurse Practitioner

## 2015-11-05 VITALS — BP 118/69 | HR 87 | Wt 183.0 lb

## 2015-11-05 DIAGNOSIS — O24419 Gestational diabetes mellitus in pregnancy, unspecified control: Secondary | ICD-10-CM

## 2015-11-05 DIAGNOSIS — Z36 Encounter for antenatal screening of mother: Secondary | ICD-10-CM | POA: Insufficient documentation

## 2015-11-05 DIAGNOSIS — Z3689 Encounter for other specified antenatal screening: Secondary | ICD-10-CM

## 2015-11-05 DIAGNOSIS — O24912 Unspecified diabetes mellitus in pregnancy, second trimester: Secondary | ICD-10-CM | POA: Insufficient documentation

## 2015-11-05 DIAGNOSIS — Z3A18 18 weeks gestation of pregnancy: Secondary | ICD-10-CM | POA: Insufficient documentation

## 2015-11-06 ENCOUNTER — Other Ambulatory Visit (HOSPITAL_COMMUNITY): Payer: Self-pay

## 2015-11-06 DIAGNOSIS — O24919 Unspecified diabetes mellitus in pregnancy, unspecified trimester: Secondary | ICD-10-CM

## 2015-11-10 ENCOUNTER — Ambulatory Visit: Payer: Self-pay | Admitting: *Deleted

## 2015-11-10 ENCOUNTER — Encounter: Payer: Self-pay | Attending: Obstetrics & Gynecology | Admitting: Dietician

## 2015-11-10 DIAGNOSIS — Z713 Dietary counseling and surveillance: Secondary | ICD-10-CM | POA: Insufficient documentation

## 2015-11-10 DIAGNOSIS — O24419 Gestational diabetes mellitus in pregnancy, unspecified control: Secondary | ICD-10-CM | POA: Insufficient documentation

## 2015-11-10 NOTE — Progress Notes (Signed)
Diabetes Education: 11/10/15 Kristina Campbell is a 30 y/o G5P4 and EDD of 04/04/16. Seen this morning with the assistance of the Spanish interpreter.  Was told she had prediabetes prior to this pregnancy.  Has a strong family hx of type 2 DM. Completed review of GDM.   Completed review of self-care measures following the pregnancy, to help with controlling blood glucose levels. Review of factors increasing blood glucose and those lowering blood glucose. Advise to walk 30 minutes daily and if having elevated post meal blood glucose levels try walking 15-20 minutes after each meal. Provided a True Track meter kit along with extra strips and lancets.  Instructed in blood glucose monitoring using this meter.  Instructed to monitor fast and 2 hr. Post parandial glucose readings.  Record these readings and bring her meter and glucose log to all clinic appointments.  A return demonstration provided a reading of 136 mg/dl  for a post breakfast level. Review of the carb restricted GDM diet and carb counting.  Provided the handout "Nutrition, Diabetes and Pregnancy" in Spanish along with the green Spanish carb counting card. Maggie May, RN, RD, LDN

## 2015-12-01 ENCOUNTER — Encounter: Payer: Self-pay | Admitting: Obstetrics & Gynecology

## 2015-12-01 ENCOUNTER — Ambulatory Visit (INDEPENDENT_AMBULATORY_CARE_PROVIDER_SITE_OTHER): Payer: Self-pay | Admitting: Obstetrics and Gynecology

## 2015-12-01 ENCOUNTER — Encounter: Payer: Self-pay | Admitting: Obstetrics and Gynecology

## 2015-12-01 ENCOUNTER — Encounter: Payer: Self-pay | Attending: Obstetrics & Gynecology | Admitting: Dietician

## 2015-12-01 VITALS — BP 106/63 | HR 91 | Wt 185.9 lb

## 2015-12-01 DIAGNOSIS — Z713 Dietary counseling and surveillance: Secondary | ICD-10-CM | POA: Insufficient documentation

## 2015-12-01 DIAGNOSIS — O24419 Gestational diabetes mellitus in pregnancy, unspecified control: Secondary | ICD-10-CM | POA: Insufficient documentation

## 2015-12-01 DIAGNOSIS — Z789 Other specified health status: Secondary | ICD-10-CM | POA: Insufficient documentation

## 2015-12-01 DIAGNOSIS — O24414 Gestational diabetes mellitus in pregnancy, insulin controlled: Secondary | ICD-10-CM

## 2015-12-01 LAB — POCT URINALYSIS DIP (DEVICE)
Bilirubin Urine: NEGATIVE
Glucose, UA: >=1000 mg/dL — AB
Ketones, ur: NEGATIVE mg/dL
Leukocytes, UA: NEGATIVE
NITRITE: NEGATIVE
PH: 6 (ref 5.0–8.0)
PROTEIN: NEGATIVE mg/dL
Specific Gravity, Urine: 1.025 (ref 1.005–1.030)
UROBILINOGEN UA: 0.2 mg/dL (ref 0.0–1.0)

## 2015-12-01 LAB — GLUCOSE, CAPILLARY: Glucose-Capillary: 146 mg/dL — ABNORMAL HIGH (ref 65–99)

## 2015-12-01 MED ORDER — GLUCOSE BLOOD VI STRP: ORAL_STRIP | 4 refills | Status: DC

## 2015-12-01 MED ORDER — INSULIN NPH (HUMAN) (ISOPHANE) 100 UNIT/ML ~~LOC~~ SUSP: SUBCUTANEOUS | 4 refills | Status: DC

## 2015-12-01 MED ORDER — INSULIN ASPART 100 UNIT/ML ~~LOC~~ SOLN: 7.0000 [IU] | Freq: Three times a day (TID) | SUBCUTANEOUS | 12 refills | Status: DC

## 2015-12-01 MED ORDER — "INSULIN SYRINGE 30G X 5/16"" 0.3 ML MISC": 1.0000 | Freq: Four times a day (QID) | 4 refills | Status: DC

## 2015-12-01 MED ORDER — RELION ALCOHOL SWABS PADS: 1.0000 [IU] | MEDICATED_PAD | 2 refills | Status: DC | PRN

## 2015-12-01 NOTE — Progress Notes (Addendum)
Diabetes Education 12/01/15 Seen today for insulin instructions.  Assisted by Okey Regalarol our Spanish interpreter. Review of the long-acting NPH (cloudy) insulin and its actions and times for injecting and when it will peak. Review of Aspart  (clear)  insulin and its action and its peak. Review of mixing insulin.  Provided handout showing the steps for mixing insulins as we went through the procedure. Practiced mixing insulin, pulling up a single and a mixed dose of insulin. Practiced injecting saline into foam injecting pad.   Provided handout ""Mezclando Insulinas Para Inyectarse", our Spanish handout for mixing insulins along with handout "Sosis de Insulina Para:" with the appropriate dose and time for each insulin. Will be taking : NPH 25 units at 10 pm and 10 units at 10 AM (Eats breakfast at 10:00 AM).  Will take 7 units of Aspart  10 minutes before each meal.  Will follow as needed. Kristina Zyon Rosser, RN, RD, LSN

## 2015-12-01 NOTE — Progress Notes (Signed)
Prenatal Visit Note Date: 12/01/2015 [facility]  Subjective:  Kristina Campbell is a 30 y.o. G5P4004 at [redacted]w[redacted]d being seen today for ongoing prenatal care.  She is currently monitored for the following issues for this high-risk pregnancy and has Previous cesarean section x 4 complicating pregnancy; Supervision of high-risk pregnancy; A2/B Gestational diabetes mellitus, antepartum; and Language barrier on her problem list.  Patient reports no complaints.   Contractions: Not present. Vag. Bleeding: None.  Movement: Present. Denies leaking of fluid.   The following portions of the patient's history were reviewed and updated as appropriate: allergies, current medications, past family history, past medical history, past social history, past surgical history and problem list. Problem list updated.  Objective:   Vitals:   12/01/15 0831  BP: 106/63  Pulse: 91  Weight: 185 lb 14.4 oz (84.3 kg)    Fetal Status: Fetal Heart Rate (bpm): 155   Movement: Present     General:  Alert, oriented and cooperative. Patient is in no acute distress.  Skin: Skin is warm and dry. No rash noted.   Cardiovascular: Normal heart rate noted  Respiratory: Normal respiratory effort, no problems with respiration noted  Abdomen: Soft, gravid, appropriate for gestational age. Pain/Pressure: Absent     Pelvic:  Cervical exam deferred        Extremities: Normal range of motion.  Edema: None  Mental Status: Normal mood and affect. Normal behavior. Normal judgment and thought content.   Urinalysis: Urine Protein: Negative Urine Glucose: 4+  Assessment and Plan:  Pregnancy: G5P4004 at [redacted]w[redacted]d *Pregnancy: routine care *GDM: patient didn't bring log book or meter but states AM fastings are in the 100s and 2hr PP in the 140s and patient also with large amount of gluosuria. POC BS today is 146 and patient states she hasn't had anything to eat or drink today. H/o GDM in the past but she  states she was never on meds. I told her based on her values that she's report she should be on insulin given the values and her current GA. Will have her see DM insulin teaching  for NPH 25/10 and aspart 7/77 Has growth u/s for later this month already.  Patient confirms on PNV and baby ASA *Language: interpreter used  Preterm labor symptoms and general obstetric precautions including but not limited to vaginal bleeding, contractions, leaking of fluid and fetal movement were reviewed in detail with the patient. Please refer to After Visit Summary for other counseling recommendations.  RTC 7-10d for BS review  [place] Bingharlie Kalab Camps, MD

## 2015-12-01 NOTE — Addendum Note (Signed)
Addended by: Watkins BingPICKENS, Iban Utz on: 12/01/2015 09:34 AM   Modules accepted: Orders

## 2015-12-03 ENCOUNTER — Other Ambulatory Visit (HOSPITAL_COMMUNITY): Payer: Self-pay | Admitting: *Deleted

## 2015-12-03 ENCOUNTER — Ambulatory Visit (HOSPITAL_COMMUNITY)
Admission: RE | Admit: 2015-12-03 | Discharge: 2015-12-03 | Disposition: A | Discharge status: Home / Self Care | Payer: Self-pay | Source: Ambulatory Visit | Attending: Nurse Practitioner | Admitting: Nurse Practitioner

## 2015-12-03 ENCOUNTER — Telehealth: Payer: Self-pay

## 2015-12-03 ENCOUNTER — Other Ambulatory Visit (HOSPITAL_COMMUNITY): Payer: Self-pay | Admitting: Maternal and Fetal Medicine

## 2015-12-03 ENCOUNTER — Encounter (HOSPITAL_COMMUNITY): Payer: Self-pay

## 2015-12-03 DIAGNOSIS — O24319 Unspecified pre-existing diabetes mellitus in pregnancy, unspecified trimester: Secondary | ICD-10-CM

## 2015-12-03 DIAGNOSIS — O24919 Unspecified diabetes mellitus in pregnancy, unspecified trimester: Secondary | ICD-10-CM

## 2015-12-03 DIAGNOSIS — O24419 Gestational diabetes mellitus in pregnancy, unspecified control: Secondary | ICD-10-CM | POA: Insufficient documentation

## 2015-12-03 DIAGNOSIS — O34219 Maternal care for unspecified type scar from previous cesarean delivery: Secondary | ICD-10-CM | POA: Insufficient documentation

## 2015-12-03 DIAGNOSIS — Z3A22 22 weeks gestation of pregnancy: Secondary | ICD-10-CM | POA: Insufficient documentation

## 2015-12-03 NOTE — Telephone Encounter (Signed)
This patient cannot afford her insulin medications do to having now insurance. Dr.Pickens do you want to try a pill form for her?   Thanks

## 2015-12-05 NOTE — Telephone Encounter (Signed)
She told me she had medicaid which should cover this. Is this not true and if not, what kind of insurance does she have?

## 2015-12-08 ENCOUNTER — Telehealth: Payer: Self-pay

## 2015-12-08 ENCOUNTER — Telehealth: Payer: Self-pay | Admitting: Obstetrics and Gynecology

## 2015-12-08 MED ORDER — METFORMIN HCL 500 MG PO TABS
500.0000 mg | ORAL_TABLET | Freq: Two times a day (BID) | ORAL | 4 refills | Status: DC
Start: 2015-12-08 — End: 2015-12-15

## 2015-12-08 NOTE — Telephone Encounter (Signed)
OB Note  Patient apparently doesn't have insurance and no resources available for insulin in the clinic.  Will do metformin 500 bidac (low dose given just starting the med) with and see her back in 7-10d and likely have to go up on doseage.  Kristina Campbell, Jr MD [facility]ending [facility](Faculty Practice)

## 2015-12-13 NOTE — Telephone Encounter (Signed)
Discuss medications changes with patient. She currently does not have insurance. Per provider patient will start taking metformin since it is on the $4 co-payment list. Patient verbalizes understanding at this time.

## 2015-12-15 ENCOUNTER — Ambulatory Visit (INDEPENDENT_AMBULATORY_CARE_PROVIDER_SITE_OTHER): Payer: Self-pay | Admitting: Obstetrics and Gynecology

## 2015-12-15 VITALS — BP 118/69 | HR 92 | Wt 188.9 lb

## 2015-12-15 DIAGNOSIS — O24415 Gestational diabetes mellitus in pregnancy, controlled by oral hypoglycemic drugs: Secondary | ICD-10-CM

## 2015-12-15 DIAGNOSIS — Z789 Other specified health status: Secondary | ICD-10-CM

## 2015-12-15 DIAGNOSIS — O0992 Supervision of high risk pregnancy, unspecified, second trimester: Secondary | ICD-10-CM

## 2015-12-15 MED ORDER — METFORMIN HCL 500 MG PO TABS: 1000.0000 mg | ORAL_TABLET | Freq: Two times a day (BID) | ORAL | 4 refills | Status: DC

## 2015-12-15 NOTE — Progress Notes (Signed)
   PRENATAL VISIT NOTE  Subjective:  Kristina Campbell is a 30 y.o. G5P4004 at 7856w1d being seen today for ongoing prenatal care.  She is currently monitored for the following issues for this high-risk pregnancy and has Previous cesarean section x 4 complicating pregnancy; Supervision of high-risk pregnancy; A2/B Gestational diabetes mellitus, antepartum; and Language barrier on her problem list.   Patient has not been taking her surgars regularily, but does have her log with her. She remains with elevated blood sugars often in the 130-150s post prandial and in the 110;s in the AM fasting. She does report she is taking the meformin as prescribed.   Patient reports no complaints.  Contractions: Not present. Vag. Bleeding: None.  Movement: Present. Denies leaking of fluid.   The following portions of the patient's history were reviewed and updated as appropriate: allergies, current medications, past family history, past medical history, past social history, past surgical history and problem list. Problem list updated.  Objective:   Vitals:   12/15/15 1335  BP: 118/69  Pulse: 92  Weight: 188 lb 14.4 oz (85.7 kg)    Fetal Status: Fetal Heart Rate (bpm): 150    Movement: Present     General:  Alert, oriented and cooperative. Patient is in no acute distress.  Skin: Skin is warm and dry. No rash noted.   Cardiovascular: Normal heart rate noted  Respiratory: Normal respiratory effort, no problems with respiration noted  Abdomen: Soft, gravid, appropriate for gestational age. Pain/Pressure: Absent     Pelvic:  Cervical exam deferred        Extremities: Normal range of motion.  Edema: None  Mental Status: Normal mood and affect. Normal behavior. Normal judgment and thought content.   Assessment and Plan:  Pregnancy: G5P4004 at 1956w1d  1. Gestational diabetes mellitus (GDM) controlled on oral hypoglycemic drug, antepartum -diabetes educator spoke with the pt today and encouraged  exercise in addition to medication -increase metformin to 1000mg  BID -gave pt more lancets and test strips -follow up in 2 wks for BS check -US scheduled for early november  2. Supervision of high risk pregnancy in second trimester Continue routine care  3. Language barrier Intreprtor         sILVIA [id number] used on stratus.   Preterm labor symptoms and general obstetric precautions including but not limited to vaginal bleeding, contractions, leaking of fluid and fetal movement were reviewed in detail with the patient. Please refer to After Visit Summary for other counseling recommendations.  Return in about 2 weeks (around 12/29/2015) for for BS follow up.  Lorne SkeensNicholas Michael Schenk, MD

## 2015-12-15 NOTE — Progress Notes (Signed)
Kristina Campbell [id number]

## 2015-12-15 NOTE — Patient Instructions (Signed)
Diabetes mellitus gestacional (Gestational Diabetes Mellitus) La diabetes mellitus gestacional, ms comnmente conocida como diabetes gestacional es un tipo de diabetes que desarrollan algunas mujeres durante el embarazo. En la diabetes gestacional, el pncreas no produce suficiente insulina (una hormona) o las clulas son menos sensibles a la insulina producida (resistencia a la insulina), o ambas cosas. Normalmente, la insulina mueve los azcares de los alimentos a las clulas de los tejidos. Las clulas de los tejidos utilizan los azcares para obtener energa. La falta de insulina o la falta de una respuesta normal a la insulina hace que el exceso de azcar se acumule en la sangre en lugar de penetrar en las clulas de los tejidos. Como resultado, se producen niveles altos de azcar en la sangre (hiperglucemia). El efecto de los niveles altos de azcar (glucosa) puede causar muchos problemas.  FACTORES DE RIESGO Usted tiene mayor probabilidad de desarrollar diabetes gestacional si tiene antecedentes familiares de diabetes y tambin si tiene uno o ms de los siguientes factores de riesgo:  ndice de masa corporal superior a 30 (obesidad).  Embarazo previo con diabetes gestacional.  La edad avanzada en el momento del embarazo. Si se mantienen los niveles de glucosa en la sangre en un rango normal durante el embarazo, las mujeres pueden tener un embarazo saludable. Si los niveles de glucosa en la sangre no estn bien controlados, puede haber riesgos para usted, el feto o el recin nacido, o durante el trabajo de parto y el parto.  SNTOMAS  Si se presentan sntomas, stos son similares a los sntomas que normalmente experimentar durante el embarazo. Los sntomas de la diabetes gestacional son:   Aumento de la sed (polidipsia).  Aumento de la miccin (poliuria).  Orina con ms frecuencia durante la noche (nocturia).  Prdida de peso. La prdida de peso puede ser muy rpida.  Infecciones  frecuentes y recurrentes.  Cansancio (fatiga).  Debilidad.  Cambios en la visin, como visin borrosa.  Olor a fruta en el aliento.  Dolor abdominal. DIAGNSTICO La diabetes se diagnostica cuando hay aumento de los niveles de glucosa en la sangre. El nivel de glucosa en la sangre puede controlarse en uno o ms de los siguientes anlisis de sangre:  Medicin de glucosa en la sangre en ayunas. No se le permitir comer durante al menos 8 horas antes de que se tome una muestra de sangre.  Pruebas al azar de glucosa en la sangre. El nivel de glucosa en la sangre se controla en cualquier momento del da sin importar el momento en que haya comido.  Prueba de tolerancia a la glucosa oral (PTGO). La glucosa en la sangre se mide despus de no haber comido (ayunas) durante una a tres horas y despus de beber una bebida que contenga glucosa. Dado que las hormonas que causan la resistencia a la insulina son ms altas alrededor de las semanas 24 a 28 de embarazo, generalmente se realiza una PTGO durante ese tiempo. Si tiene factores de riesgo, en la primera visita prenatal pueden hacerle pruebas de deteccin de diabetes tipo 2 no diagnosticada. TRATAMIENTO  La diabetes gestacional debe controlarse en primer lugar con dieta y ejercicios. Pueden agregarse medicamentos, pero solo si son necesarios.  Usted tendr que tomar medicamentos para la diabetes o insulina diariamente para mantener los niveles de glucosa en la sangre en el rango deseado.  Usted tendr que combinar la dosis de insulina con la actividad fsica y la eleccin de alimentos saludables. Si tiene diabetes gestacional, el objetivo del tratamiento ser   mantener los siguientes niveles sanguneos de glucosa:  Antes de las comidas (preprandial): valor de 95 mg/dl o inferior.  Despus de las comidas (posprandial):  Una hora despus de la comida: valor de 140 mg/dl o inferior.  Dos horas despus de la comida: valor de 120 mg/dl o  inferior. Si tiene diabetes tipo 1 o tipo 2 preexistente, el objetivo del tratamiento ser mantener los siguientes niveles sanguneos de glucosa:  Antes de las comidas, a la hora de acostarse y durante la noche: de 60 a 99 mg/dl.  Despus de las comidas: valor mximo de 100 a 129 mg/dl. INSTRUCCIONES PARA EL CUIDADO EN EL HOGAR   Controle su nivel de hemoglobina A1c dos veces al ao.  Contrlese a diario el nivel de glucosa en la sangre segn las indicaciones de su mdico. Es comn realizar controles frecuentes de la glucosa en la sangre.  Supervise las cetonas en la orina cuando est enferma y segn las indicaciones de su mdico.  Tome el medicamento para la diabetes y adminstrese insulina segn las indicaciones de su mdico para mantener el nivel de glucosa en la sangre en el rango deseado.  Nunca se quede sin medicamento para la diabetes o sin insulina. Es necesario que la reciba todos los das.  Ajuste la insulina segn la ingesta de hidratos de carbono. Los hidratos de carbono pueden aumentar los niveles de glucosa en la sangre, pero deben incluirse en su dieta. Los hidratos de carbono aportan vitaminas, minerales y fibra que son una parte esencial de una dieta saludable. Los hidratos de carbono se encuentran en frutas, verduras, cereales integrales, productos lcteos, legumbres y alimentos que contienen azcares aadidos.  Consuma alimentos saludables. Alterne 3 comidas con 3 colaciones.  Aumente de peso saludablemente. El aumento del peso total vara de acuerdo con el ndice de masa corporal que tena antes del embarazo ([facility]).  Lleve una tarjeta de alerta mdica o use una pulsera o medalla de alerta mdica.  Lleve con usted una colacin de 15gramos de hidratos de carbono en todo momento para controlar los niveles bajos de glucosa en la sangre (hipoglucemia). Algunos ejemplos de colaciones de 15gramos de hidratos de carbono son los siguientes:  Tabletas de glucosa, 3 o 4.  Gel  de glucosa, tubo de 15 gramos.  Pasas de uva, 2 cucharadas (24 g).  Caramelos de goma, 6.  Galletas de animales, 8.  Jugo de fruta, gaseosa comn, o leche descremada, 4 onzas (120 ml).  Pastillas de goma, 9.  Reconocer la hipoglucemia. Durante el embarazo la hipoglucemia se produce cuando hay niveles de glucosa en la sangre de 60 mg/dl o menos. El riesgo de hipoglucemia aumenta durante el ayuno o cuando se saltea las comidas, durante o despus de realizar ejercicio intenso y mientras duerme. Los sntomas de hipoglucemia son:  Temblores o sacudidas.  Disminucin de la capacidad de concentracin.  Sudoracin.  Aumento de la frecuencia cardaca.  Dolor de cabeza.  Sequedad en la boca.  Hambre.  Irritabilidad.  Ansiedad.  Sueo agitado.  Alteracin del habla o de la coordinacin.  Confusin.  Tratar la hipoglucemia rpidamente. Si usted est alerta y puede tragar con seguridad, siga la regla de 15/15 que consiste en:  Tome entre 15 y 20gramos de glucosa de accin rpida o carbohidratos. Las opciones de accin rpida son un gel de glucosa, tabletas de glucosa, o 4 onzas (120 ml) de jugo de frutas, gaseosa comn, o leche baja en grasa.  Compruebe su nivel de glucosa en la sangre   15 minutos despus de tomar la glucosa.  Tome entre 15 y 20 gramos ms de glucosa si el nivel de glucosa en la sangre todava es de 70mg/dl o inferior.  Ingiera una comida o una colacin en el lapso de 1 hora una vez que los niveles de glucosa en la sangre vuelven a la normalidad.  Est atento a la poliuria (miccin excesiva) y la polidipsia (sensacin de mucha sed), que son los primeros signos de la hiperglucemia. El reconocimiento temprano de la hiperglucemia permite un tratamiento oportuno. Trate la hiperglucemia segn le indic su mdico.  Haga actividad fsica por lo menos 30minutos al da o como lo indique su mdico. Se recomienda que 30 minutos despus de cada comida, realice diez minutos  de actividad fsica para controlar los niveles de glucosa postprandial en la sangre.  Ajuste su dosis de insulina y la ingesta de alimentos, segn sea necesario, si inicia un nuevo ejercicio o deporte.  Siga su plan para los das de enfermedad cuando no pueda comer o beber como de costumbre.  Evite el tabaco y el alcohol.  Concurra a todas las visitas de control como se lo haya indicado el mdico.  Siga el consejo del mdico respecto a los controles prenatales y posteriores al parto (postparto), las visitas, la planificacin de las comidas, el ejercicio, los medicamentos, las vitaminas, los anlisis de sangre, otras pruebas mdicas y actividades fsicas.  Realice diariamente el cuidado de la piel y de los pies. Examine su piel y los pies diariamente para ver si tiene cortes, moretones, enrojecimiento, problemas en las uas, sangrado, ampollas o llagas.  Cepllese los dientes y encas por lo menos dos veces al da y use hilo dental al menos una vez por da. Concurra regularmente a las visitas de control con el dentista.  Programe un examen de vista durante el primer trimestre de su embarazo o como lo indique su mdico.  Comparta su plan de control de diabetes en el trabajo o en la escuela.  Mantngase al da con las vacunas.  Aprenda a manejar el estrs.  Obtenga la mayor cantidad posible de informacin sobre la diabetes y solicite ayuda siempre que sea necesario.  Obtenga informacin sobre el amamantamiento y analice esta posibilidad.  Debe controlar el nivel de azcar en la sangre de 6a 12semanas despus del parto. Esto se hace con una prueba de tolerancia a la glucosa oral (PTGO). SOLICITE ATENCIN MDICA SI:   No puede comer alimentos o beber por ms de 6 horas.  Tuvo nuseas o ha vomitado durante ms de 6 horas.  Tiene un nivel de glucosa en la sangre de 200 mg/dl y cetonas en la orina.  Presenta algn cambio en el estado mental.  Desarrolla problemas de visin.  Sufre  un dolor persistente de cabeza.  Siente dolor o molestias en la parte superior del abdomen.  Desarrolla una enfermedad grave adicional.  Tuvo diarrea durante ms de 6 horas.  Ha estado enfermo o ha tenido fiebre durante un par de das y no mejora. SOLICITE ATENCIN MDICA DE INMEDIATO SI:   Tiene dificultad para respirar.  Ya no siente los movimientos del beb.  Est sangrando o tiene flujo vaginal.  Comienza a tener contracciones o trabajo de parto prematuro. ASEGRESE DE QUE:  Comprende estas instrucciones.  Controlar su afeccin.  Recibir ayuda de inmediato si no mejora o si empeora.   Esta informacin no tiene como fin reemplazar el consejo del mdico. Asegrese de hacerle al mdico cualquier pregunta que tenga.     Document Released: 11/18/2004 Document Revised: 03/01/2014 Elsevier Interactive Patient Education 2016 [employer]  

## 2015-12-15 NOTE — Telephone Encounter (Signed)
This has been address and patient has been placed on Metformin

## 2015-12-15 NOTE — Telephone Encounter (Signed)
Called pt and call was disconnected after ringing twice. If pt does not have insurance, her medication may be less expensive [facility].

## 2015-12-31 ENCOUNTER — Other Ambulatory Visit (HOSPITAL_COMMUNITY): Payer: Self-pay | Admitting: Maternal and Fetal Medicine

## 2015-12-31 ENCOUNTER — Ambulatory Visit (HOSPITAL_COMMUNITY)
Admission: RE | Admit: 2015-12-31 | Discharge: 2015-12-31 | Disposition: A | Discharge status: Home / Self Care | Payer: Self-pay | Source: Ambulatory Visit | Attending: Nurse Practitioner | Admitting: Nurse Practitioner

## 2015-12-31 ENCOUNTER — Other Ambulatory Visit (HOSPITAL_COMMUNITY): Payer: Self-pay | Admitting: *Deleted

## 2015-12-31 ENCOUNTER — Ambulatory Visit (INDEPENDENT_AMBULATORY_CARE_PROVIDER_SITE_OTHER): Payer: Self-pay | Admitting: Obstetrics and Gynecology

## 2015-12-31 ENCOUNTER — Encounter (HOSPITAL_COMMUNITY): Payer: Self-pay

## 2015-12-31 VITALS — BP 117/60 | HR 92 | Wt 187.4 lb

## 2015-12-31 DIAGNOSIS — Z7984 Long term (current) use of oral hypoglycemic drugs: Principal | ICD-10-CM

## 2015-12-31 DIAGNOSIS — O24319 Unspecified pre-existing diabetes mellitus in pregnancy, unspecified trimester: Secondary | ICD-10-CM

## 2015-12-31 DIAGNOSIS — Z23 Encounter for immunization: Secondary | ICD-10-CM

## 2015-12-31 DIAGNOSIS — Z3A26 26 weeks gestation of pregnancy: Secondary | ICD-10-CM

## 2015-12-31 DIAGNOSIS — O24415 Gestational diabetes mellitus in pregnancy, controlled by oral hypoglycemic drugs: Secondary | ICD-10-CM

## 2015-12-31 DIAGNOSIS — O0992 Supervision of high risk pregnancy, unspecified, second trimester: Secondary | ICD-10-CM

## 2015-12-31 DIAGNOSIS — O34219 Maternal care for unspecified type scar from previous cesarean delivery: Secondary | ICD-10-CM

## 2015-12-31 DIAGNOSIS — O24919 Unspecified diabetes mellitus in pregnancy, unspecified trimester: Secondary | ICD-10-CM

## 2015-12-31 DIAGNOSIS — O24419 Gestational diabetes mellitus in pregnancy, unspecified control: Secondary | ICD-10-CM | POA: Insufficient documentation

## 2015-12-31 LAB — CBC
HEMATOCRIT: 34.2 % — AB (ref 35.0–45.0)
HEMOGLOBIN: 11.8 g/dL (ref 11.7–15.5)
MCH: 32.7 pg (ref 27.0–33.0)
MCHC: 34.5 g/dL (ref 32.0–36.0)
MCV: 94.7 fL (ref 80.0–100.0)
MPV: 10.2 fL (ref 7.5–12.5)
Platelets: 220 10*3/uL (ref 140–400)
RBC: 3.61 MIL/uL — AB (ref 3.80–5.10)
RDW: 13.9 % (ref 11.0–15.0)
WBC: 8.2 10*3/uL (ref 3.8–10.8)

## 2015-12-31 LAB — POCT URINALYSIS DIP (DEVICE)
BILIRUBIN URINE: NEGATIVE
Glucose, UA: 500 mg/dL — AB
KETONES UR: NEGATIVE mg/dL
LEUKOCYTES UA: NEGATIVE
Nitrite: NEGATIVE
Protein, ur: NEGATIVE mg/dL
SPECIFIC GRAVITY, URINE: 1.02 (ref 1.005–1.030)
UROBILINOGEN UA: 0.2 mg/dL (ref 0.0–1.0)
pH: 5.5 (ref 5.0–8.0)

## 2015-12-31 MED ORDER — TETANUS-DIPHTH-ACELL PERTUSSIS 5-2.5-18.5 LF-MCG/0.5 IM SUSP
0.5000 mL | Freq: Once | INTRAMUSCULAR | Status: AC
  Administered 2015-12-31: 0.5 mL via INTRAMUSCULAR

## 2015-12-31 NOTE — Progress Notes (Signed)
   PRENATAL VISIT NOTE  Subjective:  Kristina Campbell is a 30 y.o. G5P4004 at 6840w3d being seen today for ongoing prenatal care.  She is currently monitored for the following issues for this high-risk pregnancy and has Previous cesarean section x 4 complicating pregnancy; Supervision of high-risk pregnancy; A2/B Gestational diabetes mellitus, antepartum; and Language barrier on her problem list.  Patient reports no complaints.  Contractions: Not present. Vag. Bleeding: None.  Movement: Present. Denies leaking of fluid.   The following portions of the patient's history were reviewed and updated as appropriate: allergies, current medications, past family history, past medical history, past social history, past surgical history and problem list. Problem list updated.  Objective:   Vitals:   12/31/15 0828  BP: 117/60  Pulse: 92  Weight: 187 lb 6.4 oz (85 kg)    Fetal Status: Fetal Heart Rate (bpm): 160 Fundal Height: 27 cm Movement: Present     General:  Alert, oriented and cooperative. Patient is in no acute distress.  Skin: Skin is warm and dry. No rash noted.   Cardiovascular: Normal heart rate noted  Respiratory: Normal respiratory effort, no problems with respiration noted  Abdomen: Soft, gravid, appropriate for gestational age. Pain/Pressure: Present     Pelvic:  Cervical exam deferred        Extremities: Normal range of motion.  Edema: Trace  Mental Status: Normal mood and affect. Normal behavior. Normal judgment and thought content.   Assessment and Plan:  Pregnancy: G5P4004 at 6340w3d  1. Supervision of high risk pregnancy in second trimester - CBC - RPR - HIV antibody - Tdap (BOOSTRIX) injection 0.5 mL; Inject 0.5 mLs into the muscle once. -patient still undecided about birth control. Considering BTL, but uninsured at this time.  2. Gestational diabetes mellitus (GDM) controlled on oral hypoglycemic drug, antepartum -Patient reports compliance with 1000mg   metformin BID -initially great response with last two weeks with sugars all wnl aside from 1-2 per week.  -This week sugars are more elevated with abnormal fasting and post prandial. She reports that she has not exercised at all this week which she was doing previously.  -encouraged patient to exercise more regularly.  -follow up with diabetes ED this week or next. -consider adding glyburide at next visit if sugars are not controled.  Preterm labor symptoms and general obstetric precautions including but not limited to vaginal bleeding, contractions, leaking of fluid and fetal movement were reviewed in detail with the patient. Please refer to After Visit Summary for other counseling recommendations.  Return in about 4 weeks (around 01/28/2016) for HROB, next week if possible with diabets ed.Lorne Skeens.   Zamere Pasternak Michael Kataleah Bejar, MD

## 2015-12-31 NOTE — Progress Notes (Signed)
Used [employer] interpreter Hemby Bridgeristina [phone]

## 2015-12-31 NOTE — Patient Instructions (Signed)
Tercer trimestre de embarazo  (Third Trimester of Pregnancy)  El tercer trimestre comprende desde la semana 29 hasta la semana 42, es decir, desde el mes 7 hasta el mes 9. En este trimestre, el feto crece muy rápido. Hacia el final del noveno mes, el feto mide alrededor de 20 pulgadas (45 cm) de largo y pesa entre 6 y 10 libras (2,700 y 4,500 kg).   CUIDADOS EN EL HOGAR   · No fume, no consuma hierbas ni beba alcohol. No tome fármacos que el médico no haya autorizado.  · No consuma ningún producto que contenga tabaco, lo que incluye cigarrillos, tabaco de mascar o cigarrillos electrónicos. Si necesita ayuda para dejar de fumar, consulte al médico. Puede recibir asesoramiento u otro tipo de apoyo para dejar de fumar.  · Tome los medicamentos solamente como se lo haya indicado el médico. Algunos medicamentos son seguros para tomar durante el embarazo y otros no lo son.  · Haga ejercicios solamente como se lo haya indicado el médico. Interrumpa la actividad física si comienza a tener calambres.  · Ingiera alimentos saludables de manera regular.  · Use un sostén que le brinde buen soporte si sus mamas están sensibles.  · No se dé baños de inmersión en agua caliente, baños turcos ni saunas.  · Colóquese el cinturón de seguridad cuando conduzca.  · No coma carne cruda ni queso sin cocinar; evite el contacto con las bandejas sanitarias de los gatos y la tierra que estos animales usan.  · Tome las vitaminas prenatales.  · Tome entre 1500 y 2000 mg de calcio diariamente comenzando en la semana 20 del embarazo hasta el parto.  · Pruebe tomar un medicamento que la ayude a defecar (un laxante suave) si el médico lo autoriza. Consuma más fibra, que se encuentra en las frutas y verduras frescas y los cereales integrales. Beba suficiente líquido para mantener el pis (orina) claro o de color amarillo pálido.  · Dese baños de asiento con agua tibia para aliviar el dolor o las molestias causadas por las hemorroides. Use una crema  para las hemorroides si el médico la autoriza.  · Si se le hinchan las venas (venas varicosas), use medias de descanso. Levante (eleve) los pies durante 15 minutos, 3 o 4 veces por día. Limite el consumo de sal en su dieta.  · No levante objetos pesados, use zapatos de tacones bajos y siéntese derecha.  · Descanse con las piernas elevadas si tiene calambres o dolor de cintura.  · Visite a su dentista si no lo ha hecho durante el embarazo. Use un cepillo de cerdas suaves para cepillarse los dientes. Pásese el hilo dental con suavidad.  · Puede seguir manteniendo relaciones sexuales, a menos que el médico le indique lo contrario.  · No haga viajes de larga distancia, excepto si es obligatorio y solamente con la aprobación del médico.  · Tome clases prenatales.  · Practique ir manejando al hospital.  · Prepare el bolso que llevará al hospital.  · Prepare la habitación del bebé.  · Concurra a los controles médicos.  SOLICITE AYUDA SI:  · No está segura de si está en trabajo de parto o si ha roto la bolsa de las aguas.  · Tiene mareos.  · Siente calambres leves o presión en la parte inferior del abdomen.  · Sufre un dolor persistente en el abdomen.  · Tiene malestar estomacal (náuseas), vómitos, o tiene deposiciones acuosas (diarrea).  · Advierte un olor fétido que proviene de la vagina.  · Siente dolor al orinar.  SOLICITE AYUDA DE INMEDIATO SI:   ·   Tiene fiebre.  · Tiene una pérdida de líquido por la vagina.  · Tiene sangrado o pequeñas pérdidas vaginales.  · Siente dolor intenso o cólicos en el abdomen.  · Sube o baja de peso rápidamente.  · Tiene dificultades para recuperar el aliento y siente dolor en el pecho.  · Súbitamente se le hinchan mucho el rostro, las manos, los tobillos, los pies o las piernas.  · No ha sentido los movimientos del bebé durante una hora.  · Siente un dolor de cabeza intenso que no se alivia con medicamentos.  · Su visión se modifica.     Esta información no tiene como fin reemplazar el  consejo del médico. Asegúrese de hacerle al médico cualquier pregunta que tenga.     Document Released: 10/11/2012 Document Revised: 03/01/2014  Elsevier Interactive Patient Education ©2016 Elsevier Inc.

## 2016-01-01 LAB — HIV ANTIBODY (ROUTINE TESTING W REFLEX): HIV 1&2 Ab, 4th Generation: NONREACTIVE

## 2016-01-01 LAB — RPR

## 2016-01-05 ENCOUNTER — Ambulatory Visit: Payer: Self-pay | Admitting: *Deleted

## 2016-01-05 ENCOUNTER — Encounter: Payer: Self-pay | Attending: Obstetrics & Gynecology | Admitting: Dietician

## 2016-01-05 DIAGNOSIS — O24419 Gestational diabetes mellitus in pregnancy, unspecified control: Secondary | ICD-10-CM

## 2016-01-05 DIAGNOSIS — Z713 Dietary counseling and surveillance: Secondary | ICD-10-CM | POA: Insufficient documentation

## 2016-01-05 NOTE — Progress Notes (Addendum)
Diabetes Education: 01/05/16 Seen for f/u and strips and lancets.  Review of blood glucose levels.  Encouraged more walking at this time.  Provided 1 box lancets and 2 boxes of strips.   Maggie Abb Gobert, RN, RD, LDN

## 2016-01-28 ENCOUNTER — Ambulatory Visit (INDEPENDENT_AMBULATORY_CARE_PROVIDER_SITE_OTHER): Payer: Self-pay | Admitting: Family Medicine

## 2016-01-28 VITALS — BP 119/65 | HR 101 | Wt 190.6 lb

## 2016-01-28 DIAGNOSIS — O34219 Maternal care for unspecified type scar from previous cesarean delivery: Secondary | ICD-10-CM

## 2016-01-28 DIAGNOSIS — O0993 Supervision of high risk pregnancy, unspecified, third trimester: Secondary | ICD-10-CM

## 2016-01-28 DIAGNOSIS — O24415 Gestational diabetes mellitus in pregnancy, controlled by oral hypoglycemic drugs: Secondary | ICD-10-CM

## 2016-01-28 MED ORDER — GLYBURIDE 2.5 MG PO TABS: 2.5000 mg | ORAL_TABLET | Freq: Every day | ORAL | 3 refills | Status: DC

## 2016-01-28 NOTE — Progress Notes (Signed)
Video Interpreter # [phone] [id number]

## 2016-01-28 NOTE — Progress Notes (Signed)
rou

## 2016-01-28 NOTE — Progress Notes (Signed)
    PRENATAL VISIT NOTE  Subjective:  Kristina Campbell is a 30 y.o. G5P4004 at 2415w3d being seen today for ongoing prenatal care.  She is currently monitored for the following issues for this high-risk pregnancy and has Previous cesarean section x 4 complicating pregnancy; Supervision of high-risk pregnancy; A2/B Gestational diabetes mellitus, antepartum; and Language barrier on her problem list.  Patient reports no complaints.  Contractions: Irritability. Vag. Bleeding: None.  Movement: Present. Denies leaking of fluid.   The following portions of the patient's history were reviewed and updated as appropriate: allergies, current medications, past family history, past medical history, past social history, past surgical history and problem list. Problem list updated.  Objective:   Vitals:   01/28/16 1020  BP: 119/65  Pulse: (!) 101  Weight: 190 lb 9.6 oz (86.5 kg)    Fetal Status: Fetal Heart Rate (bpm): 161 Fundal Height: 32 cm Movement: Present     General:  Alert, oriented and cooperative. Patient is in no acute distress.  Skin: Skin is warm and dry. No rash noted.   Cardiovascular: Normal heart rate noted  Respiratory: Normal respiratory effort, no problems with respiration noted  Abdomen: Soft, gravid, appropriate for gestational age. Pain/Pressure: Present     Pelvic:  Cervical exam deferred        Extremities: Normal range of motion.  Edema: Trace  Mental Status: Normal mood and affect. Normal behavior. Normal judgment and thought content.  FBS 91-135 (none in range) 2 hr pp 96-184 (most in range) Assessment and Plan:  Pregnancy: G5P4004 at 6215w3d  1. Gestational diabetes mellitus (GDM) controlled on oral hypoglycemic drug, antepartum Continue Glucophage, add Glyburide at bedtime to help with fastings. Begin 2x/wk testing at 32 wks--next visit. - glyBURIDE (DIABETA) 2.5 MG tablet; Take 1 tablet (2.5 mg total) by mouth at bedtime.  Dispense: 30 tablet; Refill:  3  2. Previous cesarean section x 4 complicating pregnancy Schedule RCS with BTL on 03/29/15 or later--dating based on 6 wks u/s  3. Supervision of high risk pregnancy in third trimester Continue prenatal care.   Preterm labor symptoms and general obstetric precautions including but not limited to vaginal bleeding, contractions, leaking of fluid and fetal movement were reviewed in detail with the patient. Please refer to After Visit Summary for other counseling recommendations.  Return in 2 weeks (on 02/11/2016) for OB visit and NST.   Reva Boresanya S Saray Capasso, MD

## 2016-01-28 NOTE — Patient Instructions (Signed)
Tercer trimestre de embarazo (Third Trimester of Pregnancy) El tercer trimestre comprende desde la semana29 hasta la semana42, es decir, desde el mes7 hasta el mes9. El tercer trimestre es un perodo en el que el feto crece rpidamente. Hacia el final del noveno mes, el feto mide alrededor de 20pulgadas (45cm) de largo y pesa entre 6 y 10 libras (2,700 y 4,500kg). CAMBIOS EN EL ORGANISMO Su organismo atraviesa por muchos cambios durante el embarazo, y estos varan de una mujer a otra.  Seguir aumentando de peso. Es de esperar que aumente entre 25 y 35libras (11 y 16kg) hacia el final del embarazo.  Podrn aparecer las primeras estras en las caderas, el abdomen y las mamas.  Puede tener necesidad de orinar con ms frecuencia porque el feto baja hacia la pelvis y ejerce presin sobre la vejiga.  Debido al embarazo podr sentir acidez estomacal con frecuencia.  Puede estar estreida, ya que ciertas hormonas enlentecen los movimientos de los msculos que empujan los desechos a travs de los intestinos.  Pueden aparecer hemorroides o abultarse e hincharse las venas (venas varicosas).  Puede sentir dolor plvico debido al aumento de peso y a que las hormonas del embarazo relajan las articulaciones entre los huesos de la pelvis. El dolor de espalda puede ser consecuencia de la sobrecarga de los msculos que soportan la postura.  Tal vez haya cambios en el cabello que pueden incluir su engrosamiento, crecimiento rpido y cambios en la textura. Adems, a algunas mujeres se les cae el cabello durante o despus del embarazo, o tienen el cabello seco o fino. Lo ms probable es que el cabello se le normalice despus del nacimiento del beb.  Las mamas seguirn creciendo y le dolern. A veces, puede haber una secrecin amarilla de las mamas llamada calostro.  El ombligo puede salir hacia afuera.  Puede sentir que le falta el aire debido a que se expande el tero.  Puede notar que el feto  "baja" o lo siente ms bajo, en el abdomen.  Puede tener una prdida de secrecin mucosa con sangre. Esto suele ocurrir en el trmino de unos pocos das a una semana antes de que comience el trabajo de parto.  El cuello del tero se vuelve delgado y blando (se borra) cerca de la fecha de parto. QU DEBE ESPERAR EN LOS EXMENES PRENATALES Le harn exmenes prenatales cada 2semanas hasta la semana36. A partir de ese momento le harn exmenes semanales. Durante una visita prenatal de rutina:  La pesarn para asegurarse de que usted y el feto estn creciendo normalmente.  Le tomarn la presin arterial.  Le medirn el abdomen para controlar el desarrollo del beb.  Se escucharn los latidos cardacos fetales.  Se evaluarn los resultados de los estudios solicitados en visitas anteriores.  Le revisarn el cuello del tero cuando est prxima la fecha de parto para controlar si este se ha borrado. Alrededor de la semana36, el mdico le revisar el cuello del tero. Al mismo tiempo, realizar un anlisis de las secreciones del tejido vaginal. Este examen es para determinar si hay un tipo de bacteria, estreptococo Grupo B. El mdico le explicar esto con ms detalle. El mdico puede preguntarle lo siguiente:  Cmo le gustara que fuera el parto.  Cmo se siente.  Si siente los movimientos del beb.  Si ha tenido sntomas anormales, como prdida de lquido, sangrado, dolores de cabeza intensos o clicos abdominales.  Si est consumiendo algn producto que contenga tabaco, como cigarrillos, tabaco de mascar y   cigarrillos electrnicos.  Si tiene alguna pregunta. Otros exmenes o estudios de deteccin que pueden realizarse durante el tercer trimestre incluyen lo siguiente:  Anlisis de sangre para controlar los niveles de hierro (anemia).  Controles fetales para determinar su salud, nivel de actividad y crecimiento. Si tiene alguna enfermedad o hay problemas durante el embarazo, le harn  estudios.  Prueba del VIH (virus de inmunodeficiencia humana). Si corre un riesgo alto, pueden realizarle una prueba de deteccin del VIH durante el tercer trimestre del embarazo. FALSO TRABAJO DE PARTO Es posible que sienta contracciones leves e irregulares que finalmente desaparecen. Se llaman contracciones de Braxton Hicks o falso trabajo de parto. Las contracciones pueden durar horas, das o incluso semanas, antes de que el verdadero trabajo de parto se inicie. Si las contracciones ocurren a intervalos regulares, se intensifican o se hacen dolorosas, lo mejor es que la revise el mdico. SIGNOS DE TRABAJO DE PARTO  Clicos de tipo menstrual.  Contracciones cada 5minutos o menos.  Contracciones que comienzan en la parte superior del tero y se extienden hacia abajo, a la zona inferior del abdomen y la espalda.  Sensacin de mayor presin en la pelvis o dolor de espalda.  Una secrecin de mucosidad acuosa o con sangre que sale de la vagina. Si tiene alguno de estos signos antes de la semana37 del embarazo, llame a su mdico de inmediato. Debe concurrir al hospital para que la controlen inmediatamente. INSTRUCCIONES PARA EL CUIDADO EN EL HOGAR  Evite fumar, consumir hierbas, beber alcohol y tomar frmacos que no le hayan recetado. Estas sustancias qumicas afectan la formacin y el desarrollo del beb.  No consuma ningn producto que contenga tabaco, lo que incluye cigarrillos, tabaco de mascar y cigarrillos electrnicos. Si necesita ayuda para dejar de fumar, consulte al mdico. Puede recibir asesoramiento y otro tipo de recursos para dejar de fumar.  Siga las indicaciones del mdico en relacin con el uso de medicamentos. Durante el embarazo, hay medicamentos que son seguros de tomar y otros que no.  Haga ejercicio solamente como se lo haya indicado el mdico. Sentir clicos uterinos es un buen signo para detener la actividad fsica.  Contine comiendo alimentos sanos con  regularidad.  Use un sostn que le brinde buen soporte si le duelen las mamas.  No se d baos de inmersin en agua caliente, baos turcos ni saunas.  Use el cinturn de seguridad en todo momento mientras conduce.  No coma carne cruda ni queso sin cocinar; evite el contacto con las bandejas sanitarias de los gatos y la tierra que estos animales usan. Estos elementos contienen grmenes que pueden causar defectos congnitos en el beb.  Tome las vitaminas prenatales.  Tome entre 1500 y 2000mg de calcio diariamente comenzando en la semana20 del embarazo hasta el parto.  Si est estreida, pruebe un laxante suave (si el mdico lo autoriza). Consuma ms alimentos ricos en fibra, como vegetales y frutas frescos y cereales integrales. Beba gran cantidad de lquido para mantener la orina de tono claro o color amarillo plido.  Dese baos de asiento con agua tibia para aliviar el dolor o las molestias causadas por las hemorroides. Use una crema para las hemorroides si el mdico la autoriza.  Si tiene venas varicosas, use medias de descanso. Eleve los pies durante 15minutos, 3 o 4veces por da. Limite el consumo de sal en su dieta.  Evite levantar objetos pesados, use zapatos de tacones bajos y mantenga una buena postura.  Descanse con las piernas elevadas si tiene   calambres o dolor de cintura.  Visite a su dentista si no lo ha hecho durante el embarazo. Use un cepillo de dientes blando para higienizarse los dientes y psese el hilo dental con suavidad.  Puede seguir manteniendo relaciones sexuales, a menos que el mdico le indique lo contrario.  No haga viajes largos excepto que sea absolutamente necesario y solo con la autorizacin del mdico.  Tome clases prenatales para entender, practicar y hacer preguntas sobre el trabajo de parto y el parto.  Haga un ensayo de la partida al hospital.  Prepare el bolso que llevar al hospital.  Prepare la habitacin del beb.  Concurra a todas  las visitas prenatales segn las indicaciones de su mdico.  SOLICITE ATENCIN MDICA SI:  No est segura de que est en trabajo de parto o de que ha roto la bolsa de las aguas.  Tiene mareos.  Siente clicos leves, presin en la pelvis o dolor persistente en el abdomen.  Tiene nuseas, vmitos o diarrea persistentes.  Observa una secrecin vaginal con mal olor.  Siente dolor al orinar.  SOLICITE ATENCIN MDICA DE INMEDIATO SI:  Tiene fiebre.  Tiene una prdida de lquido por la vagina.  Tiene sangrado o pequeas prdidas vaginales.  Siente dolor intenso o clicos en el abdomen.  Sube o baja de peso rpidamente.  Tiene dificultad para respirar y siente dolor de pecho.  Sbitamente se le hinchan mucho el rostro, las manos, los tobillos, los pies o las piernas.  No ha sentido los movimientos del beb durante una hora.  Siente un dolor de cabeza intenso que no se alivia con medicamentos.  Su visin se modifica.  Esta informacin no tiene como fin reemplazar el consejo del mdico. Asegrese de hacerle al mdico cualquier pregunta que tenga. Document Released: 11/18/2004 Document Revised: 03/01/2014 Document Reviewed: 04/11/2012 Elsevier Interactive Patient Education  2017 [employer]   Lactancia materna (Breastfeeding) Decidir amamantar es una de las mejores elecciones que puede hacer por usted y su beb. El cambio hormonal durante el embarazo produce el desarrollo del tejido mamario y aumenta la cantidad y el tamao de los conductos galactforos. Estas hormonas tambin permiten que las protenas, los azcares y las grasas de la sangre produzcan la leche materna en las glndulas productoras de leche. Las hormonas impiden que la leche materna sea liberada antes del nacimiento del beb, adems de impulsar el flujo de leche luego del nacimiento. Una vez que ha comenzado a amamantar, pensar en el beb, as como la succin o el llanto, pueden estimular la liberacin de leche  de las glndulas productoras de leche. LOS BENEFICIOS DE AMAMANTAR Para el beb  La primera leche (calostro) ayuda a mejorar el funcionamiento del sistema digestivo del beb.  La leche tiene anticuerpos que ayudan a prevenir las infecciones en el beb.  El beb tiene una menor incidencia de asma, alergias y del sndrome de muerte sbita del lactante.  Los nutrientes en la leche materna son mejores para el beb que la leche maternizada y estn preparados exclusivamente para cubrir las necesidades del beb.  La leche materna mejora el desarrollo cerebral del beb.  Es menos probable que el beb desarrolle otras enfermedades, como obesidad infantil, asma o diabetes mellitus de tipo 2. Para usted  La lactancia materna favorece el desarrollo de un vnculo muy especial entre la madre y el beb.  Es conveniente. La leche materna siempre est disponible a la temperatura correcta y es econmica.  La lactancia materna ayuda a quemar caloras y   a perder el peso ganado durante el embarazo.  Favorece la contraccin del tero al tamao que tena antes del embarazo de manera ms rpida y disminuye el sangrado (loquios) despus del parto.  La lactancia materna contribuye a reducir el riesgo de desarrollar diabetes mellitus de tipo 2, osteoporosis o cncer de mama o de ovario en el futuro. SIGNOS DE QUE EL BEB EST HAMBRIENTO Primeros signos de hambre  Aumenta su estado de alerta o actividad.  Se estira.  Mueve la cabeza de un lado a otro.  Mueve la cabeza y abre la boca cuando se le toca la mejilla o la comisura de la boca (reflejo de bsqueda).  Aumenta las vocalizaciones, tales como sonidos de succin, se relame los labios, emite arrullos, suspiros, o chirridos.  Mueve la mano hacia la boca.  Se chupa con ganas los dedos o las manos. Signos tardos de hambre  Est agitado.  Llora de manera intermitente. Signos de hambre extrema Los signos de hambre extrema requerirn que lo calme y  lo consuele antes de que el beb pueda alimentarse adecuadamente. No espere a que se manifiesten los siguientes signos de hambre extrema para comenzar a amamantar:  Agitacin.  Llanto intenso y fuerte.  Gritos. INFORMACIN BSICA SOBRE LA LACTANCIA MATERNA Iniciacin de la lactancia materna  Encuentre un lugar cmodo para sentarse o acostarse, con un buen respaldo para el cuello y la espalda.  Coloque una almohada o una manta enrollada debajo del beb para acomodarlo a la altura de la mama (si est sentada). Las almohadas para amamantar se han diseado especialmente a fin de servir de apoyo para los brazos y el beb mientras amamanta.  Asegrese de que el abdomen del beb est frente al suyo.  Masajee suavemente la mama. Con las yemas de los dedos, masajee la pared del pecho hacia el pezn en un movimiento circular. Esto estimula el flujo de leche. Es posible que deba continuar este movimiento mientras amamanta si la leche fluye lentamente.  Sostenga la mama con el pulgar por arriba del pezn y los otros 4 dedos por debajo de la mama. Asegrese de que los dedos se encuentren lejos del pezn y de la boca del beb.  Empuje suavemente los labios del beb con el pezn o con el dedo.  Cuando la boca del beb se abra lo suficiente, acrquelo rpidamente a la mama e introduzca todo el pezn y la zona oscura que lo rodea (areola), tanto como sea posible, dentro de la boca del beb. ? Debe haber ms areola visible por arriba del labio superior del beb que por debajo del labio inferior. ? La lengua del beb debe estar entre la enca inferior y la mama.  Asegrese de que la boca del beb est en la posicin correcta alrededor del pezn (prendida). Los labios del beb deben crear un sello sobre la mama y estar doblados hacia afuera (invertidos).  Es comn que el beb succione durante 2 a 3 minutos para que comience el flujo de leche materna. Cmo debe prenderse Es muy importante que le ensee al  beb cmo prenderse adecuadamente a la mama. Si el beb no se prende adecuadamente, puede causarle dolor en el pezn y reducir la produccin de leche materna, y hacer que el beb tenga un escaso aumento de peso. Adems, si el beb no se prende adecuadamente al pezn, puede tragar aire durante la alimentacin. Esto puede causarle molestias al beb. Hacer eructar al beb al cambiar de mama puede ayudarlo a liberar el   aire. Sin embargo, ensearle al beb cmo prenderse a la mama adecuadamente es la mejor manera de evitar que se sienta molesto por tragar aire mientras se alimenta. Signos de que el beb se ha prendido adecuadamente al pezn:  Tironea o succiona de modo silencioso, sin causarle dolor.  Se escucha que traga cada 3 o 4 succiones.  Hay movimientos musculares por arriba y por delante de sus odos al succionar. Signos de que el beb no se ha prendido adecuadamente al pezn:  Hace ruidos de succin o de chasquido mientras se alimenta.  Siente dolor en el pezn. Si cree que el beb no se prendi correctamente, deslice el dedo en la comisura de la boca y colquelo entre las encas del beb para interrumpir la succin. Intente comenzar a amamantar nuevamente. Signos de lactancia materna exitosa Signos del beb:  Disminuye gradualmente el nmero de succiones o cesa la succin por completo.  Se duerme.  Relaja el cuerpo.  Retiene una pequea cantidad de leche en la boca.  Se desprende solo del pecho. Signos que presenta usted:  Las mamas han aumentado la firmeza, el peso y el tamao 1 a 3 horas despus de amamantar.  Estn ms blandas inmediatamente despus de amamantar.  Un aumento del volumen de leche, y tambin un cambio en su consistencia y color se producen hacia el quinto da de lactancia materna.  Los pezones no duelen, ni estn agrietados ni sangran. Signos de que su beb recibe la cantidad de leche suficiente  Mojar por lo menos 1 o 2 paales durante las primeras 24  horas despus del nacimiento.  Mojar por lo menos 5 o 6 paales cada 24 horas durante la primera semana despus del nacimiento. La orina debe ser transparente o de color amarillo plido a los 5 das despus del nacimiento.  Mojar entre 6 y 8 paales cada 24 horas a medida que el beb sigue creciendo y desarrollndose.  Defeca al menos 3 veces en 24 horas a los 5 das de vida. La materia fecal debe ser blanda y amarillenta.  Defeca al menos 3 veces en 24 horas a los 7 das de vida. La materia fecal debe ser grumosa y amarillenta.  No registra una prdida de peso mayor del 10% del peso al nacer durante los primeros 3 das de vida.  Aumenta de peso un promedio de 4 a 7onzas (113 a 198g) por semana despus de los 4 das de vida.  Aumenta de peso, diariamente, de manera uniforme a partir de los 5 das de vida, sin registrar prdida de peso despus de las 2semanas de vida. Despus de alimentarse, es posible que el beb regurgite una pequea cantidad. Esto es frecuente. FRECUENCIA Y DURACIN DE LA LACTANCIA MATERNA El amamantamiento frecuente la ayudar a producir ms leche y a prevenir problemas de dolor en los pezones e hinchazn en las mamas. Alimente al beb cuando muestre signos de hambre o si siente la necesidad de reducir la congestin de las mamas. Esto se denomina "lactancia a demanda". Evite el uso del chupete mientras trabaja para establecer la lactancia (las primeras 4 a 6 semanas despus del nacimiento del beb). Despus de este perodo, podr ofrecerle un chupete. Las investigaciones demostraron que el uso del chupete durante el primer ao de vida del beb disminuye el riesgo de desarrollar el sndrome de muerte sbita del lactante (SMSL). Permita que el nio se alimente en cada mama todo lo que desee. Contine amamantando al beb hasta que haya terminado de alimentarse. Cuando   el beb se desprende o se queda dormido mientras se est alimentando de la primera mama, ofrzcale la segunda.  Debido a que, con frecuencia, los recin nacidos permanecen somnolientos las primeras semanas de vida, es posible que deba despertar al beb para alimentarlo. Los horarios de lactancia varan de un beb a otro. Sin embargo, las siguientes reglas pueden servir como gua para ayudarla a garantizar que el beb se alimenta adecuadamente:  Se puede amamantar a los recin nacidos (bebs de 4 semanas o menos de vida) cada 1 a 3 horas.  No deben transcurrir ms de 3 horas durante el da o 5 horas durante la noche sin que se amamante a los recin nacidos.  Debe amamantar al beb 8 veces como mnimo en un perodo de 24 horas, hasta que comience a introducir slidos en su dieta, a los 6 meses de vida aproximadamente. EXTRACCIN DE LECHE MATERNA La extraccin y el almacenamiento de la leche materna le permiten asegurarse de que el beb se alimente exclusivamente de leche materna, aun en momentos en los que no puede amamantar. Esto tiene especial importancia si debe regresar al trabajo en el perodo en que an est amamantando o si no puede estar presente en los momentos en que el beb debe alimentarse. Su asesor en lactancia puede orientarla sobre cunto tiempo es seguro almacenar leche materna. El sacaleche es un aparato que le permite extraer leche de la mama a un recipiente estril. Luego, la leche materna extrada puede almacenarse en un refrigerador o congelador. Algunos sacaleches son manuales, mientras que otros son elctricos. Consulte a su asesor en lactancia qu tipo ser ms conveniente para usted. Los sacaleches se pueden comprar; sin embargo, algunos hospitales y grupos de apoyo a la lactancia materna alquilan sacaleches mensualmente. Un asesor en lactancia puede ensearle cmo extraer leche materna manualmente, en caso de que prefiera no usar un sacaleche. CMO CUIDAR LAS MAMAS DURANTE LA LACTANCIA MATERNA Los pezones se secan, agrietan y duelen durante la lactancia materna. Las siguientes  recomendaciones pueden ayudarla a mantener las mamas humectadas y sanas:  Evite usar jabn en los pezones.  Use un sostn de soporte. Aunque no son esenciales, las camisetas sin mangas o los sostenes especiales para amamantar estn diseados para acceder fcilmente a las mamas, para amamantar sin tener que quitarse todo el sostn o la camiseta. Evite usar sostenes con aro o sostenes muy ajustados.  Seque al aire sus pezones durante 3 a 4minutos despus de amamantar al beb.  Utilice solo apsitos de algodn en el sostn para absorber las prdidas de leche. La prdida de un poco de leche materna entre las tomas es normal.  Utilice lanolina sobre los pezones luego de amamantar. La lanolina ayuda a mantener la humedad normal de la piel. Si usa lanolina pura, no tiene que lavarse los pezones antes de volver a alimentar al beb. La lanolina pura no es txica para el beb. Adems, puede extraer manualmente algunas gotas de leche materna y masajear suavemente esa leche sobre los pezones, para que la leche se seque al aire. Durante las primeras semanas despus de dar a luz, algunas mujeres pueden experimentar hinchazn en las mamas (congestin mamaria). La congestin puede hacer que sienta las mamas pesadas, calientes y sensibles al tacto. El pico de la congestin ocurre dentro de los 3 a 5 das despus del parto. Las siguientes recomendaciones pueden ayudarla a aliviar la congestin:  Vace por completo las mamas al amamantar o extraer leche. Puede aplicar calor hmedo en las mamas (  en la ducha o con toallas hmedas para manos) antes de amamantar o extraer leche. Esto aumenta la circulacin y ayuda a que la leche fluya. Si el beb no vaca por completo las mamas cuando lo amamanta, extraiga la leche restante despus de que haya finalizado.  Use un sostn ajustado (para amamantar o comn) o una camiseta sin mangas durante 1 o 2 das para indicar al cuerpo que disminuya ligeramente la produccin de  leche.  Aplique compresas de hielo sobre las mamas, a menos que le resulte demasiado incmodo.  Asegrese de que el beb est prendido y se encuentre en la posicin correcta mientras lo alimenta. Si la congestin persiste luego de 48 horas o despus de seguir estas recomendaciones, comunquese con su mdico o un asesor en lactancia. RECOMENDACIONES GENERALES PARA EL CUIDADO DE LA SALUD DURANTE LA LACTANCIA MATERNA  Consuma alimentos saludables. Alterne comidas y colaciones, y coma 3 de cada una por da. Dado que lo que come afecta la leche materna, es posible que algunas comidas hagan que su beb se vuelva ms irritable de lo habitual. Evite comer este tipo de alimentos si percibe que afectan de manera negativa al beb.  Beba leche, jugos de fruta y agua para satisfacer su sed (aproximadamente 10 vasos al da).  Descanse con frecuencia, reljese y tome sus vitaminas prenatales para evitar la fatiga, el estrs y la anemia.  Contine con los autocontroles de la mama.  Evite masticar y fumar tabaco. Las sustancias qumicas de los cigarrillos que pasan a la leche materna y la exposicin al humo ambiental del tabaco pueden daar al beb.  No consuma alcohol ni drogas, incluida la marihuana. Algunos medicamentos, que pueden ser perjudiciales para el beb, pueden pasar a travs de la leche materna. Es importante que consulte a su mdico antes de tomar cualquier medicamento, incluidos todos los medicamentos recetados y de venta libre, as como los suplementos vitamnicos y herbales. Puede quedar embarazada durante la lactancia. Si desea controlar la natalidad, consulte a su mdico cules son las opciones ms seguras para el beb. SOLICITE ATENCIN MDICA SI:  Usted siente que quiere dejar de amamantar o se siente frustrada con la lactancia.  Siente dolor en las mamas o en los pezones.  Sus pezones estn agrietados o sangran.  Sus pechos estn irritados, sensibles o calientes.  Tiene un rea  hinchada en cualquiera de las mamas.  Siente escalofros o fiebre.  Tiene nuseas o vmitos.  Presenta una secrecin de otro lquido distinto de la leche materna de los pezones.  Sus mamas no se llenan antes de amamantar al beb para el quinto da despus del parto.  Se siente triste y deprimida.  El beb est demasiado somnoliento como para comer bien.  El beb tiene problemas para dormir.  Moja menos de 3 paales en 24 horas.  Defeca menos de 3 veces en 24 horas.  La piel del beb o la parte blanca de los ojos se vuelven amarillentas.  El beb no ha aumentado de peso a los 5 das de vida.  SOLICITE ATENCIN MDICA DE INMEDIATO SI:  El beb est muy cansado (letargo) y no se quiere despertar para comer.  Le sube la fiebre sin causa.  Esta informacin no tiene como fin reemplazar el consejo del mdico. Asegrese de hacerle al mdico cualquier pregunta que tenga. Document Released: 02/08/2005 Document Revised: 06/02/2015 Document Reviewed: 08/02/2012 Elsevier Interactive Patient Education  2017 [employer]  

## 2016-01-29 ENCOUNTER — Other Ambulatory Visit (HOSPITAL_COMMUNITY): Payer: Self-pay | Admitting: Maternal and Fetal Medicine

## 2016-01-29 ENCOUNTER — Encounter (HOSPITAL_COMMUNITY): Payer: Self-pay

## 2016-01-29 ENCOUNTER — Other Ambulatory Visit (HOSPITAL_COMMUNITY): Payer: Self-pay | Admitting: *Deleted

## 2016-01-29 ENCOUNTER — Ambulatory Visit (HOSPITAL_COMMUNITY)
Admission: RE | Admit: 2016-01-29 | Discharge: 2016-01-29 | Disposition: A | Discharge status: Home / Self Care | Payer: Self-pay | Source: Ambulatory Visit | Attending: Nurse Practitioner | Admitting: Nurse Practitioner

## 2016-01-29 DIAGNOSIS — O24415 Gestational diabetes mellitus in pregnancy, controlled by oral hypoglycemic drugs: Secondary | ICD-10-CM

## 2016-01-29 DIAGNOSIS — O24419 Gestational diabetes mellitus in pregnancy, unspecified control: Secondary | ICD-10-CM | POA: Insufficient documentation

## 2016-01-29 DIAGNOSIS — O24919 Unspecified diabetes mellitus in pregnancy, unspecified trimester: Secondary | ICD-10-CM

## 2016-01-29 DIAGNOSIS — Z7984 Long term (current) use of oral hypoglycemic drugs: Principal | ICD-10-CM

## 2016-01-29 DIAGNOSIS — Z3A30 30 weeks gestation of pregnancy: Secondary | ICD-10-CM

## 2016-01-29 DIAGNOSIS — O34211 Maternal care for low transverse scar from previous cesarean delivery: Secondary | ICD-10-CM | POA: Insufficient documentation

## 2016-01-29 NOTE — Addendum Note (Signed)
Encounter addended by: Marcellina MillinKelly L Keymiah Lyles on: 01/29/2016 10:48 AM<BR>    Actions taken: Imaging Exam ended

## 2016-02-12 ENCOUNTER — Ambulatory Visit (INDEPENDENT_AMBULATORY_CARE_PROVIDER_SITE_OTHER): Payer: Self-pay | Admitting: Obstetrics & Gynecology

## 2016-02-12 ENCOUNTER — Ambulatory Visit: Payer: Self-pay

## 2016-02-12 DIAGNOSIS — O3663X0 Maternal care for excessive fetal growth, third trimester, not applicable or unspecified: Secondary | ICD-10-CM

## 2016-02-12 DIAGNOSIS — O3660X0 Maternal care for excessive fetal growth, unspecified trimester, not applicable or unspecified: Secondary | ICD-10-CM | POA: Insufficient documentation

## 2016-02-12 DIAGNOSIS — O24415 Gestational diabetes mellitus in pregnancy, controlled by oral hypoglycemic drugs: Secondary | ICD-10-CM

## 2016-02-12 LAB — POCT URINALYSIS DIP (DEVICE)
BILIRUBIN URINE: NEGATIVE
Glucose, UA: 500 mg/dL — AB
Ketones, ur: NEGATIVE mg/dL
Leukocytes, UA: NEGATIVE
NITRITE: NEGATIVE
PH: 5.5 (ref 5.0–8.0)
PROTEIN: NEGATIVE mg/dL
Specific Gravity, Urine: 1.01 (ref 1.005–1.030)
Urobilinogen, UA: 0.2 mg/dL (ref 0.0–1.0)

## 2016-02-12 LAB — HEMOGLOBIN A1C
HEMOGLOBIN A1C: 6.2 % — AB (ref <5.7)
Mean Plasma Glucose: 131 mg/dL

## 2016-02-12 LAB — GLUCOSE, CAPILLARY: Glucose-Capillary: 121 mg/dL — ABNORMAL HIGH (ref 65–99)

## 2016-02-12 NOTE — Progress Notes (Signed)
Interpreter Gardiner BarefootAliz Herrera present for encounter. Pt reports vaginal itching and white d/c but not right now.      PRENATAL VISIT NOTE  Subjective:  Kristina Campbell is a 30 y.o. G5P4004 at 457w4d being seen today for ongoing prenatal care.  She is currently monitored for the following issues for this high-risk pregnancy and has Previous cesarean section x 4 complicating pregnancy; Supervision of high-risk pregnancy; A2/B Gestational diabetes mellitus, antepartum; Language barrier; and Macrosomia affecting management of mother on her problem list.  Patient reports no complaints.  Contractions: Irregular. Vag. Bleeding: None.  Movement: Present. Denies leaking of fluid.   The following portions of the patient's history were reviewed and updated as appropriate: allergies, current medications, past family history, past medical history, past social history, past surgical history and problem list. Problem list updated.  Objective:   Vitals:   02/12/16 1046  BP: 110/61  Pulse: 92  Weight: 192 lb 12.8 oz (87.5 kg)    Fetal Status: Fetal Heart Rate (bpm): NST   Movement: Present     General:  Alert, oriented and cooperative. Patient is in no acute distress.  Skin: Skin is warm and dry. No rash noted.   Cardiovascular: Normal heart rate noted  Respiratory: Normal respiratory effort, no problems with respiration noted  Abdomen: Soft, gravid, appropriate for gestational age. Pain/Pressure: Present     Pelvic:  Cervical exam deferred        Extremities: Normal range of motion.  Edema: Trace  Mental Status: Normal mood and affect. Normal behavior. Normal judgment and thought content.   Assessment and Plan:  Pregnancy: G5P4004 at 8857w4d  1. Gestational diabetes mellitus (GDM) controlled on oral hypoglycemic drug, antepartum -pt reports all cbcs as nml or slightly above nml.  Did hot bring book - Fetal nonstress test--Reactive - CBG today--ate hot cakes with sugar free syrup at 7:30  a.m. - Hgb A1c - Pt understands that hyperglycemia can lead to worsening macrosomia and even fetal death.    2. Macrosomia of fetus affecting management of mother in third trimester, single or unspecified fetus -Planned 5th c/s  Preterm labor symptoms and general obstetric precautions including but not limited to vaginal bleeding, contractions, leaking of fluid and fetal movement were reviewed in detail with the patient. Please refer to After Visit Summary for other counseling recommendations.  Return in about 5 days (around 02/17/2016) for NST only,  12/28 or 12/29 NST/AFI.   Lesly DukesKelly H Damaria Vachon, MD

## 2016-02-12 NOTE — Progress Notes (Signed)
Pt informed that the ultrasound is considered a limited OB ultrasound and is not intended to be a complete ultrasound exam.  Patient also informed that the ultrasound is not being completed with the intent of assessing for fetal or placental anomalies or any pelvic abnormalities.  Explained that the purpose of today's ultrasound is to assess for presentation and amniotic fluid volume.  Patient acknowledges the purpose of the exam and the limitations of the study.    [employer] video interpreter Gloriann LoanJanina [phone]#[id number] used following NST/AFI.  Pt advised of results and importance of adhering to diabetic diet for her good health, the health of the baby and to prevent the baby from becoming extremely large. Pt voiced understanding.

## 2016-02-14 ENCOUNTER — Other Ambulatory Visit: Payer: Self-pay | Admitting: Family Medicine

## 2016-02-17 ENCOUNTER — Ambulatory Visit (INDEPENDENT_AMBULATORY_CARE_PROVIDER_SITE_OTHER): Payer: Self-pay | Admitting: *Deleted

## 2016-02-17 VITALS — BP 110/59 | HR 89

## 2016-02-17 DIAGNOSIS — O24415 Gestational diabetes mellitus in pregnancy, controlled by oral hypoglycemic drugs: Secondary | ICD-10-CM

## 2016-02-17 NOTE — Progress Notes (Signed)
NST reviewed and reactive.  

## 2016-02-20 ENCOUNTER — Ambulatory Visit: Payer: Self-pay

## 2016-02-20 ENCOUNTER — Ambulatory Visit (INDEPENDENT_AMBULATORY_CARE_PROVIDER_SITE_OTHER): Payer: Self-pay | Admitting: Obstetrics & Gynecology

## 2016-02-20 VITALS — BP 109/66 | HR 92

## 2016-02-20 DIAGNOSIS — O24415 Gestational diabetes mellitus in pregnancy, controlled by oral hypoglycemic drugs: Secondary | ICD-10-CM

## 2016-02-20 DIAGNOSIS — Z3689 Encounter for other specified antenatal screening: Secondary | ICD-10-CM

## 2016-02-24 ENCOUNTER — Ambulatory Visit (INDEPENDENT_AMBULATORY_CARE_PROVIDER_SITE_OTHER): Payer: Self-pay | Admitting: Advanced Practice Midwife

## 2016-02-24 VITALS — BP 108/63 | HR 90 | Wt 193.8 lb

## 2016-02-24 DIAGNOSIS — O24415 Gestational diabetes mellitus in pregnancy, controlled by oral hypoglycemic drugs: Secondary | ICD-10-CM

## 2016-02-24 DIAGNOSIS — O0993 Supervision of high risk pregnancy, unspecified, third trimester: Secondary | ICD-10-CM

## 2016-02-24 DIAGNOSIS — O3663X0 Maternal care for excessive fetal growth, third trimester, not applicable or unspecified: Secondary | ICD-10-CM

## 2016-02-24 NOTE — Patient Instructions (Signed)
Diabetes mellitus gestacional, cuidados personales (Gestational Diabetes Mellitus, Self Care) El cuidado personal despus del diagnstico de diabetes gestacional (diabetes mellitus gestacional) implica mantener el nivel de glucosa en la sangre bajo control a travs del equilibrio de los siguientes factores:  Nutricin.  Actividad fsica.  Cambios en el estilo de vida.  Medicamentos o insulina, si es necesario.  El apoyo del equipo de mdicos y de Producer, television/film/video. La siguiente informacin explica lo que debe saber para mantener la diabetes gestacional bajo control en su casa. QU DEBO SABER PARA MANTENER LA GLUCEMIA BAJO CONTROL?  [place]. Haga esto con la frecuencia que le haya indicado el mdico.  Comunquese con el mdico si la glucemia est por encima del nivel ideal en 2anlisis seguidos. El [employer] objetivos personalizados de su tratamiento. Generalmente, el objetivo del tratamiento es [employer] siguientes niveles de glucemia durante el embarazo:  Despus de no haber comido durante 8horas (despus de ayunar): igual o menor que 40m/dl (5,372ml/l).  Despus de las comidas (posprandial):  Una hora despus de una comida: igual o menor que 14067ml (7,8mm51ml).  Dos horas despus de una comida: igual o menor que 120mg20m(6,7mmol57m.  Nivel de A1c (hemoglobinaA1c): del 6% al 6,5%. QU DEBO SABER SOBRE LA HIPERGBarrys la hiperglucemia?  La hiperglucemia, tambin llamada glucemia alta, ocurre cuando el nivel de glucosa en la sangre es muy elNewcomerstownrese de conoceRyland Groups tempranos de hiperglucemia, por ejemplo:  Aumento de la sed.  Hambre.  Mucho cansancio.  Necesidad de orinarGarment/textile technologistayor frecuencia que lo habitual.  Visin borrosa. Qu es la hipoglucemia?  La hipoglucemia, tambin llamada glucemia baja, ocurre cuando el nivel de glucosa en la sangre es igual  o menor que 70mg/d41m,9mmol/l61mEl riesgo de hipoglucemia aumenta durante o despus de realizarOptometristad fsica, mientras duerme, cuando est enfermo o si se saltea comidas o no come durante [employer]. Es importanPhotographeromas de la hipoglucemia y tratarla de inmediato. Lleve siempre consigo una colacin de 15gramos hidratos de carbono de accin rpida para tratar la glucemia baja.Los familiares y los amigos cercanos tambin deben conocer [employer], y comprender cmo tratar la hipoglucemia, en caso de que usted no pueda tratarse a s mismo. Cules son los sntomas de la hipoglucemia?  Los sntomas de hipoglucemia pueden incluir los siguientes:  Hambre. [place].  Sudoracin y piel hmeda.  Confusin.  Mareos o sensacin de desvanecimiento.  Somnolencia.  Nuseas.  Aumento de la frecuencia cardaca.  Dolor de cabeza. [place] borrosa.  Convulsiones.  Pesadillas.  Hormigueo o adormecimiento alrededor de la boExxon Mobil Corporationbios o la lengua. [place] en el habla.  Disminucin de la capacidad de concentracin.  Cambios en la coordinacin.  Sueo agitado.  Temblores o sacudidas.  Desmayos.  Irritabilidad. Cmo se trata la hipoglucemia?  Si est alerta y puede tragar con seguridad, siga la regla de 15/15, que consiste en lo siguiente:  Tome 15gramos de hidratos de carbono de accin rpida. Las opciones de accin rpida incluyen lo siguiente:  1pomo de glucosa en gel.  3comprimidos de glucosa.  6 a 8unidades de caramelos duros.  4onzas (120ml) de69mo de frutas.  4onzas (120ml) de 80mosa comn (no diettica).  Contrlese la glucemia 15minutos 20mus de ingerir el hidrato de carbono.  Si este nuevo nivel de glucosa en la sangre an es de 70mg/dl o m33m (3,9mmGarment/textile technologist, vu104ma a ingerir 15gramos de  un hidrato de carbono.  Si la glucemia no aumenta por encima de 70mg/dl (3,9mmol/l) despus de 3intentos, solicite ayuda mdica  de emergencia.  Ingiera una comida o una colacin en el transcurso de 1hora despus de que la glucemia se haya normalizado. Cmo se trata la hipoglucemia grave?  La hipoglucemia grave ocurre cuando la glucemia es igual o menor que 54mg/dl (3mmol/l). La hipoglucemia grave es una emergencia. No espere hasta que los sntomas desaparezcan. Solicite atencin mdica de inmediato. Comunquese con el servicio de emergencias de su localidad (911 en los [place]). No conduzca por sus propios medios hasta el hospital. Si tiene hipoglucemia grave y no puede ingerir alimentos o bebidas, tal vez deba aplicarse una inyeccin de glucagn. Un familiar o un amigo cercano deben aprender a controlarle la glucemia y a aplicarle una inyeccin de glucagn. Pregntele al mdico si debe tener disponible un kit de inyecciones de glucagn de emergencia. Es posible que la hipoglucemia grave deba tratarse en un hospital. El tratamiento puede incluir la administracin de glucosa a travs de una va intravenosa (IV). Tambin puede necesitar un tratamiento para tratar la afeccin que est causando la hipoglucemia. QU OTRAS COSAS PUEDO HACER PARA CONTROLAR LA DIABETES GESTACIONAL? Tome los medicamentos para la diabetes como se lo hayan indicado   Si el mdico le recet insulina o medicamentos para la diabetes, tmelos todos los das.  No se quede sin insulina ni cualquier otro medicamento para la diabetes que tome. Planifique con antelacin para tenerlos siempre a su disposicin.  Si usa insulina, ajuste las dosis en funcin de la cantidad de actividad fsica que realiza y de los alimentos que consume. El mdico le indicar cmo ajustar las dosis. Opte por opciones de alimentos saludables  Lo que come y bebe incide en la glucemia. Hacer buenas elecciones ayuda a mantener la diabetes bajo control y a evitar otros problemas de salud. Un plan de alimentacin saludable incluye consumir protenas magras, hidratos de carbono  complejos, frutas y verduras frescas, productos lcteos con bajo contenido de grasa y grasas saludables. Programe una cita con un especialista en alimentacin y nutricin (nutricionista certificado) para que la ayude a armar un plan de alimentacin adecuado para usted. Asegrese de lo siguiente:  Siga las indicaciones del mdico respecto de las restricciones para las comidas o las bebidas.  Beba suficiente lquido para mantener la orina clara o de color amarillo plido.  Ingiera colaciones saludables entre comidas nutritivas.  Haga un seguimiento de los hidratos de carbono que consume. Para hacerlo, lea las etiquetas de informacin nutricional y aprenda cules son los tamaos de las porciones estndar de los alimentos.  Siga el plan para los das de enfermedad cuando no pueda comer o beber normalmente. Arme este plan por adelantado con el mdico. Mantngase activa   Haga al menos 30minutos de actividad fsica por da, o tanta actividad fsica como le recomiende el mdico durante el embarazo.  Hacer 10minutos de actividad fsica 30minutos despus de cada comida puede ayudar a controlar los niveles de glucemia posprandial.  Si comienza un ejercicio o una actividad nuevos, trabaje con el mdico para ajustar la insulina, los medicamentos o la ingesta de comidas segn sea necesario. Opte por un estilo de vida saludable   No beba alcohol.  No consuma ningn producto que contenga tabaco, lo que incluye cigarrillos, tabaco de mascar y cigarrillos electrnicos. Si necesita ayuda para dejar de fumar, consulte al mdico.  Aprenda a manejar el estrs. Si necesita ayuda para lograrlo, consulte a   su mdico. Cuide su cuerpo   Mantngase al da con las vacunas.  Cepllese los dientes y las encas dos veces al da y use hilo dental al menos una vez por da.  Visite al dentista al menos una vez cada 6meses.  Mantenga un peso saludable durante el embarazo. Instrucciones generales   Tome los  medicamentos de venta libre y los recetados solamente como se lo haya indicado el mdico.  Hable con el mdico sobre el riesgo de tener hipertensin arterial durante el embarazo (preeclampsia o eclampsia).  Comparta su plan de control de la diabetes con sus compaeros de trabajo y de la escuela, y con las personas con las que convive.  Controle el nivel de cetonas en la orina durante el embarazo cuando est enferma o como se lo haya indicado el mdico.  Lleve una tarjeta de alerta mdica o use un brazalete o medalla de alerta mdica.  Pregntele al mdico:  Debo reunirme con un instructor para el cuidado de la diabetes?  Dnde puedo encontrar un grupo de apoyo para personas diabticas?  Asista a todas las visitas de control durante el embarazo (prenatal) y despus del parto (posnatal) como se lo haya indicado el mdico. Esto es importante. Procure recibir la atencin que necesita despus del parto   Hgase controlar la glucemia de 4a 12semanas despus del parto. Esto se hace con una prueba de tolerancia a la glucosa oral (PTGO).  Hgase controles de deteccin de la diabetes al menos cada 3aos o con la frecuencia que le haya indicado el mdico. DNDE ENCONTRAR MS INFORMACIN: Para obtener ms informacin sobre la diabetes gestacional, visite los siguientes sitios:  Asociacin Americana de la Diabetes (American Diabetes Association, ADA): www.diabetes.org/diabetes-basics/gestational  Centros para el Control y la Prevencin de Enfermedades (Centers for Disease Control and Prevention, CDC): www.cdc.gov/diabetes/pubs/pdf/gestationalDiabetes.pdf Esta informacin no tiene como fin reemplazar el consejo del mdico. Asegrese de hacerle al mdico cualquier pregunta que tenga. Document Released: 06/02/2015 Document Revised: 06/02/2015 Document Reviewed: 03/14/2015 Elsevier Interactive Patient Education  2017 Elsevier Inc.  

## 2016-02-24 NOTE — Progress Notes (Signed)
[employer] video interpreter [place] # [id number] used for encounter.  US for growth scheduled 02/26/16.

## 2016-02-24 NOTE — Progress Notes (Signed)
   PRENATAL VISIT NOTE  Subjective:  Kristina Campbell is a 31 y.o. G5P4004 at 7137w2d being seen today for ongoing prenatal care.  She is currently monitored for the following issues for this high-risk pregnancy and has Previous cesarean section x 4 complicating pregnancy; Supervision of high-risk pregnancy; A2/B Gestational diabetes mellitus, antepartum; Language barrier; and Macrosomia affecting management of mother on her problem list.  Patient reports no complaints.  Contractions: Irregular. Vag. Bleeding: None.  Movement: Present. Denies leaking of fluid.   The following portions of the patient's history were reviewed and updated as appropriate: allergies, current medications, past family history, past medical history, past social history, past surgical history and problem list. Problem list updated.  Objective:   Vitals:   02/24/16 1002  BP: 108/63  Pulse: 90  Weight: 193 lb 12.8 oz (87.9 kg)    Fetal Status: Fetal Heart Rate (bpm): RNST   Movement: Present     General:  Alert, oriented and cooperative. Patient is in no acute distress.  Skin: Skin is warm and dry. No rash noted.   Cardiovascular: Normal heart rate noted  Respiratory: Normal respiratory effort, no problems with respiration noted  Abdomen: Soft, gravid, appropriate for gestational age. Pain/Pressure: Present     Pelvic:  Cervical exam deferred        Extremities: Normal range of motion.  Edema: Trace  Mental Status: Normal mood and affect. Normal behavior. Normal judgment and thought content.   Assessment and Plan:  Pregnancy: G5P4004 at 5237w2d  1. Gestational diabetes mellitus (GDM) controlled on oral hypoglycemic drug, antepartum --Reviewed glucose log.  Fastings mostly 82-87 with 2 outliers in 140s and 150s ( out of ~ 10 values). PP mostly 90s to 119 but 2 outliers in 150s (out of ~10 values).  Discussed with pt and elevated values associated with known deviations of diet.  Discussed dietary changes  with pt. Also recommend light daily exercise such as 30 minute walk daily. - Fetal nonstress test  2. Macrosomia of fetus affecting management of mother in third trimester, single or unspecified fetus --Growth US 02/26/16  3. Supervision of high risk pregnancy in third trimester   Preterm labor symptoms and general obstetric precautions including but not limited to vaginal bleeding, contractions, leaking of fluid and fetal movement were reviewed in detail with the patient. Please refer to After Visit Summary for other counseling recommendations.  Return in about 6 days (around 03/01/2016) for Ob fu and NST.   Hurshel PartyLisa A Leftwich-Kirby, CNM

## 2016-02-26 ENCOUNTER — Ambulatory Visit (HOSPITAL_COMMUNITY)
Admission: RE | Admit: 2016-02-26 | Discharge: 2016-02-26 | Disposition: A | Discharge status: Home / Self Care | Payer: Self-pay | Source: Ambulatory Visit | Attending: Nurse Practitioner | Admitting: Nurse Practitioner

## 2016-02-26 ENCOUNTER — Ambulatory Visit (INDEPENDENT_AMBULATORY_CARE_PROVIDER_SITE_OTHER): Payer: Self-pay | Admitting: Student

## 2016-02-26 ENCOUNTER — Other Ambulatory Visit (HOSPITAL_COMMUNITY): Payer: Self-pay | Admitting: *Deleted

## 2016-02-26 ENCOUNTER — Encounter (HOSPITAL_COMMUNITY): Payer: Self-pay

## 2016-02-26 ENCOUNTER — Other Ambulatory Visit (HOSPITAL_COMMUNITY): Payer: Self-pay | Admitting: Obstetrics and Gynecology

## 2016-02-26 VITALS — BP 111/69 | HR 80

## 2016-02-26 DIAGNOSIS — Z3A34 34 weeks gestation of pregnancy: Secondary | ICD-10-CM

## 2016-02-26 DIAGNOSIS — O24419 Gestational diabetes mellitus in pregnancy, unspecified control: Secondary | ICD-10-CM | POA: Insufficient documentation

## 2016-02-26 DIAGNOSIS — O34219 Maternal care for unspecified type scar from previous cesarean delivery: Secondary | ICD-10-CM

## 2016-02-26 DIAGNOSIS — O24415 Gestational diabetes mellitus in pregnancy, controlled by oral hypoglycemic drugs: Secondary | ICD-10-CM

## 2016-02-26 DIAGNOSIS — O34211 Maternal care for low transverse scar from previous cesarean delivery: Secondary | ICD-10-CM | POA: Insufficient documentation

## 2016-02-26 NOTE — Progress Notes (Signed)
Reactive NST 

## 2016-03-02 ENCOUNTER — Ambulatory Visit (INDEPENDENT_AMBULATORY_CARE_PROVIDER_SITE_OTHER): Payer: Self-pay | Admitting: Medical

## 2016-03-02 VITALS — BP 110/70 | HR 79 | Wt 196.8 lb

## 2016-03-02 DIAGNOSIS — O3663X0 Maternal care for excessive fetal growth, third trimester, not applicable or unspecified: Secondary | ICD-10-CM

## 2016-03-02 DIAGNOSIS — O24415 Gestational diabetes mellitus in pregnancy, controlled by oral hypoglycemic drugs: Secondary | ICD-10-CM

## 2016-03-02 DIAGNOSIS — O0993 Supervision of high risk pregnancy, unspecified, third trimester: Secondary | ICD-10-CM

## 2016-03-02 LAB — POCT URINALYSIS DIP (DEVICE)
BILIRUBIN URINE: NEGATIVE
Glucose, UA: NEGATIVE mg/dL
Ketones, ur: NEGATIVE mg/dL
Leukocytes, UA: NEGATIVE
NITRITE: NEGATIVE
PH: 5 (ref 5.0–8.0)
PROTEIN: NEGATIVE mg/dL
Specific Gravity, Urine: 1.01 (ref 1.005–1.030)
Urobilinogen, UA: 0.2 mg/dL (ref 0.0–1.0)

## 2016-03-02 NOTE — Progress Notes (Signed)
   PRENATAL VISIT NOTE  Subjective:  Kristina Campbell is a 31 y.o. G5P4004 at 2428w2d being seen today for ongoing prenatal care.  She is currently monitored for the following issues for this high-risk pregnancy and has Previous cesarean section x 4 complicating pregnancy; Supervision of high-risk pregnancy; A2/B Gestational diabetes mellitus, antepartum; Language barrier; and Macrosomia affecting management of mother on her problem list.  Patient reports no complaints.  Contractions: Irregular. Vag. Bleeding: None.  Movement: Present. Denies leaking of fluid.   The following portions of the patient's history were reviewed and updated as appropriate: allergies, current medications, past family history, past medical history, past social history, past surgical history and problem list. Problem list updated.  Objective:   Vitals:   03/02/16 0821  BP: 110/70  Pulse: 79  Weight: 196 lb 12.8 oz (89.3 kg)    Fetal Status: Fetal Heart Rate (bpm): NST Fundal Height: 36 cm Movement: Present     General:  Alert, oriented and cooperative. Patient is in no acute distress.  Skin: Skin is warm and dry. No rash noted.   Cardiovascular: Normal heart rate noted  Respiratory: Normal respiratory effort, no problems with respiration noted  Abdomen: Soft, gravid, appropriate for gestational age. Pain/Pressure: Present     Pelvic:  Cervical exam deferred        Extremities: Normal range of motion.  Edema: Trace  Mental Status: Normal mood and affect. Normal behavior. Normal judgment and thought content.   Assessment and Plan:  Pregnancy: G5P4004 at 2228w2d  1. Gestational diabetes mellitus (GDM) controlled on oral hypoglycemic drug, antepartum - Fetal nonstress test - reactive - Log book reviewed - few elevated values, although patient does not check PP after dinner often, states that she is sleeping at time of check - advised to try to check more often  2. Macrosomia of fetus affecting management  of mother in third trimester, single or unspecified fetus - C/S repeat planned for 03/30/16  3. Supervision of high risk pregnancy in third trimester   Preterm labor symptoms and general obstetric precautions including but not limited to vaginal bleeding, contractions, leaking of fluid and fetal movement were reviewed in detail with the patient. Please refer to After Visit Summary for other counseling recommendations.  Return in about 6 days (around 03/08/2016) for Ob fu and NST.   Marny LowensteinJulie N Wenzel, PA-C

## 2016-03-02 NOTE — Patient Instructions (Signed)
Braxton Hicks Contractions Contractions of the uterus can occur throughout pregnancy. Contractions are not always a sign that you are in labor.  WHAT ARE BRAXTON HICKS CONTRACTIONS?  Contractions that occur before labor are called Braxton Hicks contractions, or false labor. Toward the end of pregnancy (32-34 weeks), these contractions can develop more often and may become more forceful. This is not true labor because these contractions do not result in opening (dilatation) and thinning of the cervix. They are sometimes difficult to tell apart from true labor because these contractions can be forceful and people have different pain tolerances. You should not feel embarrassed if you go to the hospital with false labor. Sometimes, the only way to tell if you are in true labor is for your health care provider to look for changes in the cervix. If there are no prenatal problems or other health problems associated with the pregnancy, it is completely safe to be sent home with false labor and await the onset of true labor. HOW CAN YOU TELL THE DIFFERENCE BETWEEN TRUE AND FALSE LABOR? False Labor   The contractions of false labor are usually shorter and not as hard as those of true labor.   The contractions are usually irregular.   The contractions are often felt in the front of the lower abdomen and in the groin.   The contractions may go away when you walk around or change positions while lying down.   The contractions get weaker and are shorter lasting as time goes on.   The contractions do not usually become progressively stronger, regular, and closer together as with true labor.  True Labor   Contractions in true labor last 30-70 seconds, become very regular, usually become more intense, and increase in frequency.   The contractions do not go away with walking.   The discomfort is usually felt in the top of the uterus and spreads to the lower abdomen and low back.   True labor can be  determined by your health care provider with an exam. This will show that the cervix is dilating and getting thinner.  WHAT TO REMEMBER  Keep up with your usual exercises and follow other instructions given by your health care provider.   Take medicines as directed by your health care provider.   Keep your regular prenatal appointments.   Eat and drink lightly if you think you are going into labor.   If Braxton Hicks contractions are making you uncomfortable:   Change your position from lying down or resting to walking, or from walking to resting.   Sit and rest in a tub of warm water.   Drink 2-3 glasses of water. Dehydration may cause these contractions.   Do slow and deep breathing several times an hour.  WHEN SHOULD I SEEK IMMEDIATE MEDICAL CARE? Seek immediate medical care if:  Your contractions become stronger, more regular, and closer together.   You have fluid leaking or gushing from your vagina.   You have a fever.   You pass blood-tinged mucus.   You have vaginal bleeding.   You have continuous abdominal pain.   You have low back pain that you never had before.   You feel your baby's head pushing down and causing pelvic pressure.   Your baby is not moving as much as it used to.  This information is not intended to replace advice given to you by your health care provider. Make sure you discuss any questions you have with your health care   provider. Document Released: 02/08/2005 Document Revised: 06/02/2015 Document Reviewed: 11/20/2012 Elsevier Interactive Patient Education  2017 Elsevier Inc. Introduction Patient Name: ________________________________________________ Patient Due Date: ____________________ What is a fetal movement count? A fetal movement count is the number of times that you feel your baby move during a certain amount of time. This may also be called a fetal kick count. A fetal movement count is recommended for every pregnant  woman. You may be asked to start counting fetal movements as early as week 28 of your pregnancy. Pay attention to when your baby is most active. You may notice your baby's sleep and wake cycles. You may also notice things that make your baby move more. You should do a fetal movement count:  When your baby is normally most active.  At the same time each day. A good time to count movements is while you are resting, after having something to eat and drink. How do I count fetal movements? 1. Find a quiet, comfortable area. Sit, or lie down on your side. 2. Write down the date, the start time and stop time, and the number of movements that you felt between those two times. Take this information with you to your health care visits. 3. For 2 hours, count kicks, flutters, swishes, rolls, and jabs. You should feel at least 10 movements during 2 hours. 4. You may stop counting after you have felt 10 movements. 5. If you do not feel 10 movements in 2 hours, have something to eat and drink. Then, keep resting and counting for 1 hour. If you feel at least 4 movements during that hour, you may stop counting. Contact a health care provider if:  You feel fewer than 4 movements in 2 hours.  Your baby is not moving like he or she usually does. Date: ____________ Start time: ____________ Stop time: ____________ Movements: ____________ Date: ____________ Start time: ____________ Stop time: ____________ Movements: ____________ Date: ____________ Start time: ____________ Stop time: ____________ Movements: ____________ Date: ____________ Start time: ____________ Stop time: ____________ Movements: ____________ Date: ____________ Start time: ____________ Stop time: ____________ Movements: ____________ Date: ____________ Start time: ____________ Stop time: ____________ Movements: ____________ Date: ____________ Start time: ____________ Stop time: ____________ Movements: ____________ Date: ____________ Start time:  ____________ Stop time: ____________ Movements: ____________ Date: ____________ Start time: ____________ Stop time: ____________ Movements: ____________ This information is not intended to replace advice given to you by your health care provider. Make sure you discuss any questions you have with your health care provider. Document Released: 03/10/2006 Document Revised: 10/08/2015 Document Reviewed: 03/20/2015 Elsevier Interactive Patient Education  2017 Elsevier Inc.  

## 2016-03-02 NOTE — Progress Notes (Signed)
US for growth done 1/4.

## 2016-03-04 ENCOUNTER — Ambulatory Visit: Payer: Self-pay

## 2016-03-04 ENCOUNTER — Ambulatory Visit (INDEPENDENT_AMBULATORY_CARE_PROVIDER_SITE_OTHER): Payer: Self-pay | Admitting: Obstetrics & Gynecology

## 2016-03-04 VITALS — BP 109/64 | HR 89

## 2016-03-04 DIAGNOSIS — O24415 Gestational diabetes mellitus in pregnancy, controlled by oral hypoglycemic drugs: Secondary | ICD-10-CM

## 2016-03-04 DIAGNOSIS — Z3689 Encounter for other specified antenatal screening: Secondary | ICD-10-CM

## 2016-03-04 NOTE — Progress Notes (Signed)
Pt informed that the ultrasound is considered a limited OB ultrasound and is not intended to be a complete ultrasound exam.  Patient also informed that the ultrasound is not being completed with the intent of assessing for fetal or placental anomalies or any pelvic abnormalities.  Explained that the purpose of today's ultrasound is to assess for presentation and amniotic fluid volume.  Patient acknowledges the purpose of the exam and the limitations of the study.    

## 2016-03-08 ENCOUNTER — Ambulatory Visit (INDEPENDENT_AMBULATORY_CARE_PROVIDER_SITE_OTHER): Payer: Self-pay | Admitting: Student

## 2016-03-08 ENCOUNTER — Encounter: Payer: Self-pay | Admitting: Student

## 2016-03-08 VITALS — BP 125/70 | HR 85 | Wt 198.2 lb

## 2016-03-08 DIAGNOSIS — Z789 Other specified health status: Secondary | ICD-10-CM

## 2016-03-08 DIAGNOSIS — O0993 Supervision of high risk pregnancy, unspecified, third trimester: Secondary | ICD-10-CM

## 2016-03-08 DIAGNOSIS — Z113 Encounter for screening for infections with a predominantly sexual mode of transmission: Secondary | ICD-10-CM

## 2016-03-08 DIAGNOSIS — O3663X0 Maternal care for excessive fetal growth, third trimester, not applicable or unspecified: Secondary | ICD-10-CM

## 2016-03-08 DIAGNOSIS — O24415 Gestational diabetes mellitus in pregnancy, controlled by oral hypoglycemic drugs: Secondary | ICD-10-CM

## 2016-03-08 NOTE — Progress Notes (Signed)
Interpreter Blanca Lindner present for encounter 

## 2016-03-08 NOTE — Progress Notes (Signed)
    PRENATAL VISIT NOTE  Subjective:  Kristina Campbell is a 31 y.o. G5P4004 at 538w1d being seen today for ongoing prenatal care.  She is currently monitored for the following issues for this high-risk pregnancy and has Previous cesarean section x 4 complicating pregnancy; Supervision of high-risk pregnancy; A2/B Gestational diabetes mellitus, antepartum; Language barrier; and Macrosomia affecting management of mother on her problem list.  Patient reports no complaints.  Contractions: Irregular. Vag. Bleeding: None.  Movement: Present. Denies leaking of fluid.   The following portions of the patient's history were reviewed and updated as appropriate: allergies, current medications, past family history, past medical history, past social history, past surgical history and problem list. Problem list updated.  Fasting PPB PPL PPD  67-101, 1/7 abnl 46-125, 1/7 abnl 71-158, 2/7 abnl 102-140, 3/5 abnl     Objective:   Vitals:   03/08/16 0928  BP: 125/70  Pulse: 85  Weight: 198 lb 3.2 oz (89.9 kg)    Fetal Status: Fetal Heart Rate (bpm): NST   Movement: Present     General:  Alert, oriented and cooperative. Patient is in no acute distress.  Skin: Skin is warm and dry. No rash noted.   Cardiovascular: Normal heart rate noted  Respiratory: Normal respiratory effort, no problems with respiration noted  Abdomen: Soft, gravid, appropriate for gestational age. Pain/Pressure: Present     Pelvic:  Cervical exam performed       closed/thick/high  Extremities: Normal range of motion.  Edema: Trace  Mental Status: Normal mood and affect. Normal behavior. Normal judgment and thought content.   Assessment and Plan:  Pregnancy: G5P4004 at 8538w1d  1. Gestational diabetes mellitus (GDM) controlled on oral hypoglycemic drug, antepartum -BS log reviewed -- doing well -- continues metformin -Reactive NST - Fetal nonstress test  2. Macrosomia of fetus affecting management of mother in third  trimester, single or unspecified fetus -growth u/s scheduled  3. Supervision of high risk pregnancy in third trimester  - GC/Chlamydia probe amp ([place] - Culture, beta strep (group b only)  4. Language barrier - Spanish interpreter at bedside  Term labor symptoms and general obstetric precautions including but not limited to vaginal bleeding, contractions, leaking of fluid and fetal movement were reviewed in detail with the patient. Please refer to After Visit Summary for other counseling recommendations.  Return in about 7 days (around 03/15/2016) for Ob fu and NST;  1/25  NST only - has US @ 0845.   Judeth HornErin Mykia Holton, NP

## 2016-03-09 LAB — GC/CHLAMYDIA PROBE AMP (~~LOC~~) NOT AT ARMC
Chlamydia: NEGATIVE
NEISSERIA GONORRHEA: NEGATIVE

## 2016-03-09 LAB — POCT URINALYSIS DIP (DEVICE)
BILIRUBIN URINE: NEGATIVE
Glucose, UA: 500 mg/dL — AB
KETONES UR: NEGATIVE mg/dL
NITRITE: NEGATIVE
PH: 5.5 (ref 5.0–8.0)
Protein, ur: NEGATIVE mg/dL
Specific Gravity, Urine: <=1.005 (ref 1.005–1.030)
Urobilinogen, UA: 0.2 mg/dL (ref 0.0–1.0)

## 2016-03-09 LAB — CULTURE, BETA STREP (GROUP B ONLY)

## 2016-03-11 ENCOUNTER — Telehealth: Payer: Self-pay | Admitting: *Deleted

## 2016-03-11 ENCOUNTER — Other Ambulatory Visit: Payer: Self-pay | Admitting: Obstetrics and Gynecology

## 2016-03-11 NOTE — Telephone Encounter (Signed)
Called pt w/[employer] interpreter Paulette # [phone] in order to check on her since our office was closed this morning and she could not have her appt. Pt did not answer and there is no voice mail set up - unable to leave a message. Pt has next scheduled appt on 1/22 @ 0840.

## 2016-03-15 ENCOUNTER — Ambulatory Visit (INDEPENDENT_AMBULATORY_CARE_PROVIDER_SITE_OTHER): Payer: Self-pay | Admitting: Obstetrics & Gynecology

## 2016-03-15 VITALS — BP 111/74 | HR 86 | Wt 199.6 lb

## 2016-03-15 DIAGNOSIS — O24415 Gestational diabetes mellitus in pregnancy, controlled by oral hypoglycemic drugs: Secondary | ICD-10-CM

## 2016-03-15 DIAGNOSIS — O0993 Supervision of high risk pregnancy, unspecified, third trimester: Secondary | ICD-10-CM

## 2016-03-15 DIAGNOSIS — O3663X0 Maternal care for excessive fetal growth, third trimester, not applicable or unspecified: Secondary | ICD-10-CM

## 2016-03-15 NOTE — Progress Notes (Signed)
   PRENATAL VISIT NOTE  Subjective:  Gwynneth AlimentYulisma Dominguez-Arteaga is a 31 y.o. G5P4004 at 7862w1d being seen today for ongoing prenatal care.  She is currently monitored for the following issues for this high-risk pregnancy and has Previous cesarean section x 4 complicating pregnancy; Supervision of high-risk pregnancy; A2/B Gestational diabetes mellitus, antepartum; Language barrier; and Macrosomia affecting management of mother on her problem list.  Patient reports no complaints.   .  .   . Denies leaking of fluid.   The following portions of the patient's history were reviewed and updated as appropriate: allergies, current medications, past family history, past medical history, past social history, past surgical history and problem list. Problem list updated.  Objective:  There were no vitals filed for this visit.  Fetal Status: Fetal Heart Rate (bpm): NST         General:  Alert, oriented and cooperative. Patient is in no acute distress.  Skin: Skin is warm and dry. No rash noted.   Cardiovascular: Normal heart rate noted  Respiratory: Normal respiratory effort, no problems with respiration noted  Abdomen: Soft, gravid, appropriate for gestational age.       Pelvic:  Cervical exam deferred        Extremities: Normal range of motion.     Mental Status: Normal mood and affect. Normal behavior. Normal judgment and thought content.   Assessment and Plan:  Pregnancy: G5P4004 at 8762w1d  1. Macrosomia of fetus affecting management of mother in third trimester, single or unspecified fetus -C/S and BTL scheduled.  Pt has LARGE umbilical hernia. If vertical incision is needed, may run into hernia.  RN staff is working on URGENT referral to General surgery with an interpreter so possible joint procedure can be scheduled.    2. Supervision of high risk pregnancy in third trimester Fast[place]--63-130 (only one high); 2 hrs pp break  70-136 (one abnml value); 2 hour pp lunch 91-154 (one abnml but fewer  values; 2 hr pp dinner--118 (Pt falls asleep before rwo hours.  Pt is will try checking 1 hr pp which should be <140.) 2x weekly testing and MFM US for growth. Reactive NST today.  Term labor symptoms and general obstetric precautions including but not limited to vaginal bleeding, contractions, leaking of fluid and fetal movement were reviewed in detail with the patient. Please refer to After Visit Summary for other counseling recommendations.  Return in about 7 days (around 03/22/2016) for Ob fu and NST.   Lesly DukesKelly H Severina Sykora, MD

## 2016-03-15 NOTE — Progress Notes (Signed)
Interpreter Kristina Campbell present for encounter. Pt states she has a very swollen vein on her Rt leg which gets worse with walking.

## 2016-03-17 ENCOUNTER — Telehealth: Payer: Self-pay | Admitting: General Practice

## 2016-03-17 ENCOUNTER — Telehealth (HOSPITAL_COMMUNITY): Payer: Self-pay

## 2016-03-17 NOTE — Telephone Encounter (Signed)
[facility] Dr Gerrit FriendsGerkin does not recommend the hernia repair at time of c-section due to increased risk of infection such as meconium present at delivery- he recommends waiting until 6 weeks post surgery. Scheduled surgery consult appointment for 3/7 @ 9:45. Patient is to arrive at 9:15 with ID & insurance card. Called patient with [employer] interpreter [phone], no answer- no option to leave VM. Will discuss appt at patient's visit tomorrow.

## 2016-03-17 NOTE — Telephone Encounter (Signed)
[facility] and [facility] to [facility]/s to consult about umbilical hernia. Spoke w/ Genella RifeSilvia (RN), patient information given, she is going to consult w/ her physicians there and return my call, once she finds a time to fit her in.

## 2016-03-18 ENCOUNTER — Telehealth (HOSPITAL_COMMUNITY): Payer: Self-pay | Admitting: *Deleted

## 2016-03-18 ENCOUNTER — Ambulatory Visit (HOSPITAL_COMMUNITY)
Admission: RE | Admit: 2016-03-18 | Discharge: 2016-03-18 | Disposition: A | Discharge status: Home / Self Care | Payer: Self-pay | Source: Ambulatory Visit | Attending: Nurse Practitioner | Admitting: Nurse Practitioner

## 2016-03-18 ENCOUNTER — Other Ambulatory Visit (HOSPITAL_COMMUNITY): Payer: Self-pay | Admitting: Maternal and Fetal Medicine

## 2016-03-18 ENCOUNTER — Ambulatory Visit (INDEPENDENT_AMBULATORY_CARE_PROVIDER_SITE_OTHER): Payer: Self-pay | Admitting: Advanced Practice Midwife

## 2016-03-18 ENCOUNTER — Telehealth: Payer: Self-pay | Admitting: *Deleted

## 2016-03-18 ENCOUNTER — Encounter (HOSPITAL_COMMUNITY): Payer: Self-pay

## 2016-03-18 VITALS — BP 110/71 | HR 97

## 2016-03-18 DIAGNOSIS — O24415 Gestational diabetes mellitus in pregnancy, controlled by oral hypoglycemic drugs: Secondary | ICD-10-CM

## 2016-03-18 DIAGNOSIS — O24419 Gestational diabetes mellitus in pregnancy, unspecified control: Secondary | ICD-10-CM | POA: Insufficient documentation

## 2016-03-18 DIAGNOSIS — O34219 Maternal care for unspecified type scar from previous cesarean delivery: Secondary | ICD-10-CM

## 2016-03-18 DIAGNOSIS — Z3A37 37 weeks gestation of pregnancy: Secondary | ICD-10-CM | POA: Insufficient documentation

## 2016-03-18 DIAGNOSIS — O3663X0 Maternal care for excessive fetal growth, third trimester, not applicable or unspecified: Secondary | ICD-10-CM | POA: Insufficient documentation

## 2016-03-18 DIAGNOSIS — O0993 Supervision of high risk pregnancy, unspecified, third trimester: Secondary | ICD-10-CM

## 2016-03-18 DIAGNOSIS — O403XX0 Polyhydramnios, third trimester, not applicable or unspecified: Secondary | ICD-10-CM | POA: Insufficient documentation

## 2016-03-18 NOTE — Progress Notes (Addendum)
Interpreter [employer] Mor[employer] present for encounter.  Pt advised of appt made for consultation with General Surgeon on 3/7 @ 0945 to discuss surgery for hernia repair. She will need to arrive @ 0915 with ID and insurance card. Pt also advised that the surgeon does not recommend performing the surgery at the same time as C/S due to increased risk for infection. She voiced understanding. US for growth today

## 2016-03-18 NOTE — Telephone Encounter (Signed)
Error- please disregard

## 2016-03-18 NOTE — Addendum Note (Signed)
Encounter addended by: Lestine MountSybil Jayvyn Haselton, RT on: 03/18/2016 10:55 AM<BR>    Actions taken: Imaging Exam ended

## 2016-03-18 NOTE — Progress Notes (Signed)
NST only    NST reactive

## 2016-03-18 NOTE — Telephone Encounter (Signed)
Preadmission screen Interpreter number [phone]

## 2016-03-22 ENCOUNTER — Other Ambulatory Visit: Payer: Self-pay | Admitting: Family Medicine

## 2016-03-22 ENCOUNTER — Encounter (HOSPITAL_COMMUNITY): Payer: Self-pay

## 2016-03-22 ENCOUNTER — Ambulatory Visit (INDEPENDENT_AMBULATORY_CARE_PROVIDER_SITE_OTHER): Payer: Self-pay | Admitting: Obstetrics & Gynecology

## 2016-03-22 VITALS — BP 116/73 | HR 78 | Wt 201.1 lb

## 2016-03-22 DIAGNOSIS — O24415 Gestational diabetes mellitus in pregnancy, controlled by oral hypoglycemic drugs: Secondary | ICD-10-CM

## 2016-03-22 DIAGNOSIS — K439 Ventral hernia without obstruction or gangrene: Secondary | ICD-10-CM

## 2016-03-22 DIAGNOSIS — O0993 Supervision of high risk pregnancy, unspecified, third trimester: Secondary | ICD-10-CM

## 2016-03-22 DIAGNOSIS — O3663X0 Maternal care for excessive fetal growth, third trimester, not applicable or unspecified: Secondary | ICD-10-CM

## 2016-03-22 LAB — POCT URINALYSIS DIP (DEVICE)
BILIRUBIN URINE: NEGATIVE
GLUCOSE, UA: NEGATIVE mg/dL
Ketones, ur: NEGATIVE mg/dL
LEUKOCYTES UA: NEGATIVE
NITRITE: NEGATIVE
Protein, ur: NEGATIVE mg/dL
Specific Gravity, Urine: <=1.005 (ref 1.005–1.030)
UROBILINOGEN UA: 0.2 mg/dL (ref 0.0–1.0)
pH: 5.5 (ref 5.0–8.0)

## 2016-03-22 NOTE — Progress Notes (Signed)
US for growth done 1/25.  C/S scheduled 2/6.      PRENATAL VISIT NOTE  Subjective:  Gwynneth AlimentYulisma Dominguez-Arteaga is a 31 y.o. G5P4004 at 3153w1d being seen today for ongoing prenatal care.  She is currently monitored for the following issues for this high-risk pregnancy and has Previous cesarean section x 4 complicating pregnancy; Supervision of high-risk pregnancy; A2/B Gestational diabetes mellitus, antepartum; Language barrier; Macrosomia affecting management of mother; and Hernia of abdominal wall on her problem list.  Patient reports no complaints.  Contractions: Irregular. Vag. Bleeding: None.  Movement: Present. Denies leaking of fluid.   The following portions of the patient's history were reviewed and updated as appropriate: allergies, current medications, past family history, past medical history, past social history, past surgical history and problem list. Problem list updated.  Objective:   Vitals:   03/22/16 0912  BP: 116/73  Pulse: 78  Weight: 201 lb 1.6 oz (91.2 kg)    Fetal Status: Fetal Heart Rate (bpm): NST   Movement: Present     General:  Alert, oriented and cooperative. Patient is in no acute distress.  Skin: Skin is warm and dry. No rash noted.   Cardiovascular: Normal heart rate noted  Respiratory: Normal respiratory effort, no problems with respiration noted  Abdomen: Soft, gravid, appropriate for gestational age. Pain/Pressure: Present     Pelvic:  Cervical exam deferred        Extremities: Normal range of motion.     Mental Status: Normal mood and affect. Normal behavior. Normal judgment and thought content.   Assessment and Plan:  Pregnancy: G5P4004 at 5453w1d  1. Gestational diabetes mellitus (GDM) controlled on oral hypoglycemic drug, antepartum -Some highs bur most recent are well controlled on meds.  Pt encouraged to follow diet.   - Fetal nonstress test--Reactive today.  2. Supervision of high risk pregnancy in third trimester -2x week testing.     3. Macrosomia of fetus affecting management of mother in third trimester, single or unspecified fetus C/S  4. Hernia of abdominal wall General surgery to fix after pregnancy (per Dr. Gerrit FriendsGerkin); requested appt before c/s in case joint needed (vertical skin incision).  General surgery Gerkin said he did not want to see her before the c/s.     Term labor symptoms and general obstetric precautions including but not limited to vaginal bleeding, contractions, leaking of fluid and fetal movement were reviewed in detail with the patient. Please refer to After Visit Summary for other counseling recommendations.  Return in about 7 days (around 03/29/2016) for Ob fu and NST.   Lesly DukesKelly H Telvin Reinders, MD

## 2016-03-24 ENCOUNTER — Telehealth: Payer: Self-pay | Admitting: Obstetrics & Gynecology

## 2016-03-24 NOTE — Telephone Encounter (Signed)
Pt is for rpt c/s with Dr. Debroah LoopArnold.  Per General Surgery request, pt should not get dual procedure due to risk of infection.  Pt has appt on 3/7 with General Surgery.

## 2016-03-25 ENCOUNTER — Other Ambulatory Visit: Payer: Self-pay

## 2016-03-25 ENCOUNTER — Ambulatory Visit: Payer: Self-pay

## 2016-03-25 ENCOUNTER — Ambulatory Visit (INDEPENDENT_AMBULATORY_CARE_PROVIDER_SITE_OTHER): Payer: Self-pay | Admitting: Obstetrics and Gynecology

## 2016-03-25 DIAGNOSIS — O24415 Gestational diabetes mellitus in pregnancy, controlled by oral hypoglycemic drugs: Secondary | ICD-10-CM

## 2016-03-25 DIAGNOSIS — Z3689 Encounter for other specified antenatal screening: Secondary | ICD-10-CM

## 2016-03-25 NOTE — Progress Notes (Signed)
NST Note Date: 03/25/2016 Gestational Age: 32/4 FHT: 130 baseline, positive accelerations, negative, deceleration, moderate variability Toco: rare UCs Time: 25 minutes  A/P: rNST. Continue current plan of care  Kristina Campbell, Jr MD Attending Center for [facility](Faculty Practice)

## 2016-03-25 NOTE — Progress Notes (Signed)
Pt informed that the ultrasound is considered a limited OB ultrasound and is not intended to be a complete ultrasound exam.  Patient also informed that the ultrasound is not being completed with the intent of assessing for fetal or placental anomalies or any pelvic abnormalities.  Explained that the purpose of today's ultrasound is to assess for presentation and amniotic fluid volume.  Patient acknowledges the purpose of the exam and the limitations of the study.    Pt has repeat C/S scheduled on 2/6

## 2016-03-26 ENCOUNTER — Other Ambulatory Visit: Payer: Self-pay

## 2016-03-29 ENCOUNTER — Encounter (HOSPITAL_COMMUNITY)
Admission: RE | Admit: 2016-03-29 | Discharge: 2016-03-29 | Disposition: A | Discharge status: Home / Self Care | Payer: Medicaid Other | Source: Ambulatory Visit | Attending: Family Medicine | Admitting: Family Medicine

## 2016-03-29 LAB — CBC
HCT: 41.2 % (ref 36.0–46.0)
HEMOGLOBIN: 14.1 g/dL (ref 12.0–15.0)
MCH: 32.1 pg (ref 26.0–34.0)
MCHC: 34.2 g/dL (ref 30.0–36.0)
MCV: 93.8 fL (ref 78.0–100.0)
PLATELETS: 169 10*3/uL (ref 150–400)
RBC: 4.39 MIL/uL (ref 3.87–5.11)
RDW: 14.4 % (ref 11.5–15.5)
WBC: 9.6 10*3/uL (ref 4.0–10.5)

## 2016-03-29 LAB — BASIC METABOLIC PANEL
Anion gap: 10 (ref 5–15)
BUN: 19 mg/dL (ref 6–20)
CHLORIDE: 105 mmol/L (ref 101–111)
CO2: 19 mmol/L — AB (ref 22–32)
CREATININE: 0.64 mg/dL (ref 0.44–1.00)
Calcium: 9.3 mg/dL (ref 8.9–10.3)
GFR calc non Af Amer: >60 mL/min (ref >60)
Glucose, Bld: 50 mg/dL — ABNORMAL LOW (ref 65–99)
POTASSIUM: 3.8 mmol/L (ref 3.5–5.1)
SODIUM: 134 mmol/L — AB (ref 135–145)

## 2016-03-29 LAB — TYPE AND SCREEN
ABO/RH(D): O POS
ANTIBODY SCREEN: NEGATIVE

## 2016-03-29 LAB — RAPID HIV SCREEN (HIV 1/2 AB+AG)
HIV 1/2 Antibodies: NONREACTIVE
HIV-1 P24 ANTIGEN - HIV24: NONREACTIVE

## 2016-03-29 NOTE — Pre-Procedure Instructions (Signed)
Glucose of 50 noted on preop visit after pt was out of building.  Pt was not symptomatic during visit.  Dr Montel CulverKerrigan notified and order placed to recheck glucose in the am.

## 2016-03-29 NOTE — Patient Instructions (Signed)
Instrucciones Para Antes de la Ciruga   Su ciruga est programada para 03/30/2016  (your procedure is scheduled on) Entre por la entrada principal del [facility]  a las [address]30 de la [place] -(enter through the main entrance at [facility] at  [facility]    [address] el t[address]ono, [place] el [phone] para informarnos de su llegada. (pick up phone, dial [phone] on arrival)     Por favor llame al [phone] si tiene algn problema en la maana de la ciruga (please call  if you have any problems the morning of surgery.)                  Recuerde: (Remember)  No coma alimentos despues a la media de noche. (Do not eat food )    No tome lquidos claros despues a la media de noche. (Do not drink clear liquids )    No use joyas, maquillaje de ojos, lpiz labial, crema para el cuerpo o esmalte de uas oscuro. (Do not wear jewelry, eye makeup, lipstick, body lotion, or dark fingernail polish). Puede usar desodorante (you may wear deodorant)    No se afeite 48 horas de su ciruga. (Do not shave 48 hours before your surgery)    No traiga objetos de valor al hospital.  [facility] no se hace responsable de ninguna pertenencia, ni objetos de valor que haya trado al hospital. (Do not bring valuable to the hospital.  [facility] is not responsible for any belongings or valuables brought to the hospital)   [facility] estas medicinas en la maana de la ciruga con un SORBITO de agua zantac.  No tome glyburide o metformin en la manana de la [place].  No tome metformin a noche.   (take these meds the morning of surgery with a SIP of water)     Durante la ciruga no se pueden usar lentes de contacto, dentaduras o puentes. (Contacts, dentures or bridgework cannot be worn in surgery).   Si va a ser ingresado despus de la ciruga, deje la [employer]maleta en el carro hasta que se le haya asignado una habitacin. (If you are to be admitted after surgery, leave suitcase in car until your room has  been assigned.)   A los pacientes que se les d de alta el mismo da no se les permitir manejar a casa.  (Patients discharged on the day of surgery will not be allowed to drive home)    [place] y nmero de telfono del Programmer, multimediaconductor na (Name and telephone number of your driver)   Instrucciones especiales N/A (Special Instructions)   Por favor, lea las hojas informativas que le entregaron. (Please read over the following fact sheets that you were given) Surgical Site Infection Prevention

## 2016-03-30 ENCOUNTER — Inpatient Hospital Stay (HOSPITAL_COMMUNITY): Payer: Medicaid Other | Admitting: Anesthesiology

## 2016-03-30 ENCOUNTER — Encounter (HOSPITAL_COMMUNITY)
Admission: RE | Disposition: A | Discharge status: Home / Self Care | Payer: Self-pay | Source: Ambulatory Visit | Attending: Family Medicine

## 2016-03-30 ENCOUNTER — Inpatient Hospital Stay (HOSPITAL_COMMUNITY)
Admission: RE | Admit: 2016-03-30 | Discharge: 2016-04-01 | DRG: 765 | Disposition: A | Discharge status: Home / Self Care | Payer: Medicaid Other | Source: Ambulatory Visit | Attending: Family Medicine | Admitting: Family Medicine

## 2016-03-30 ENCOUNTER — Encounter (HOSPITAL_COMMUNITY): Payer: Self-pay | Admitting: *Deleted

## 2016-03-30 DIAGNOSIS — O403XX0 Polyhydramnios, third trimester, not applicable or unspecified: Secondary | ICD-10-CM | POA: Diagnosis present

## 2016-03-30 DIAGNOSIS — O3660X0 Maternal care for excessive fetal growth, unspecified trimester, not applicable or unspecified: Secondary | ICD-10-CM | POA: Diagnosis present

## 2016-03-30 DIAGNOSIS — Z3A39 39 weeks gestation of pregnancy: Secondary | ICD-10-CM | POA: Diagnosis not present

## 2016-03-30 DIAGNOSIS — O34211 Maternal care for low transverse scar from previous cesarean delivery: Secondary | ICD-10-CM | POA: Diagnosis not present

## 2016-03-30 DIAGNOSIS — O34219 Maternal care for unspecified type scar from previous cesarean delivery: Secondary | ICD-10-CM | POA: Diagnosis present

## 2016-03-30 DIAGNOSIS — Z302 Encounter for sterilization: Secondary | ICD-10-CM

## 2016-03-30 DIAGNOSIS — K219 Gastro-esophageal reflux disease without esophagitis: Secondary | ICD-10-CM | POA: Diagnosis present

## 2016-03-30 DIAGNOSIS — O3663X0 Maternal care for excessive fetal growth, third trimester, not applicable or unspecified: Secondary | ICD-10-CM | POA: Diagnosis present

## 2016-03-30 DIAGNOSIS — Z98891 History of uterine scar from previous surgery: Secondary | ICD-10-CM

## 2016-03-30 DIAGNOSIS — O24425 Gestational diabetes mellitus in childbirth, controlled by oral hypoglycemic drugs: Secondary | ICD-10-CM | POA: Diagnosis not present

## 2016-03-30 DIAGNOSIS — Z87891 Personal history of nicotine dependence: Secondary | ICD-10-CM

## 2016-03-30 DIAGNOSIS — O9962 Diseases of the digestive system complicating childbirth: Secondary | ICD-10-CM | POA: Diagnosis present

## 2016-03-30 DIAGNOSIS — Z3A29 29 weeks gestation of pregnancy: Secondary | ICD-10-CM | POA: Diagnosis not present

## 2016-03-30 DIAGNOSIS — O24419 Gestational diabetes mellitus in pregnancy, unspecified control: Secondary | ICD-10-CM | POA: Diagnosis present

## 2016-03-30 DIAGNOSIS — O24429 Gestational diabetes mellitus in childbirth, unspecified control: Secondary | ICD-10-CM

## 2016-03-30 LAB — RPR: RPR Ser Ql: NONREACTIVE

## 2016-03-30 LAB — GLUCOSE, CAPILLARY
GLUCOSE-CAPILLARY: 61 mg/dL — AB (ref 65–99)
Glucose-Capillary: 65 mg/dL (ref 65–99)
Glucose-Capillary: 71 mg/dL (ref 65–99)

## 2016-03-30 SURGERY — Surgical Case
Anesthesia: Spinal | Site: Abdomen | Laterality: Bilateral | Wound class: Clean Contaminated

## 2016-03-30 MED ORDER — PHENYLEPHRINE 40 MCG/ML (10ML) SYRINGE FOR IV PUSH (FOR BLOOD PRESSURE SUPPORT)
PREFILLED_SYRINGE | INTRAVENOUS | Status: AC
  Filled 2016-03-30: qty 10

## 2016-03-30 MED ORDER — DIBUCAINE 1 % RE OINT: 1.0000 "application " | TOPICAL_OINTMENT | RECTAL | Status: DC | PRN

## 2016-03-30 MED ORDER — DEXTROSE 5 % IV SOLN
1.0000 ug/kg/h | INTRAVENOUS | Status: DC | PRN
  Filled 2016-03-30: qty 2

## 2016-03-30 MED ORDER — IBUPROFEN 600 MG PO TABS: 600.0000 mg | ORAL_TABLET | Freq: Four times a day (QID) | ORAL | Status: DC | PRN

## 2016-03-30 MED ORDER — ONDANSETRON HCL 4 MG/2ML IJ SOLN: 4.0000 mg | Freq: Three times a day (TID) | INTRAMUSCULAR | Status: DC | PRN

## 2016-03-30 MED ORDER — KETOROLAC TROMETHAMINE 30 MG/ML IJ SOLN: 30.0000 mg | Freq: Four times a day (QID) | INTRAMUSCULAR | Status: DC | PRN

## 2016-03-30 MED ORDER — PHENYLEPHRINE 8 MG IN D5W 100 ML (0.08MG/ML) PREMIX OPTIME
INJECTION | INTRAVENOUS | Status: AC
  Filled 2016-03-30: qty 100

## 2016-03-30 MED ORDER — SIMETHICONE 80 MG PO CHEW: 80.0000 mg | CHEWABLE_TABLET | ORAL | Status: DC | PRN

## 2016-03-30 MED ORDER — NALOXONE HCL 0.4 MG/ML IJ SOLN: 0.4000 mg | INTRAMUSCULAR | Status: DC | PRN

## 2016-03-30 MED ORDER — SIMETHICONE 80 MG PO CHEW
80.0000 mg | CHEWABLE_TABLET | Freq: Three times a day (TID) | ORAL | Status: DC
  Administered 2016-03-30 – 2016-04-01 (×5): 80 mg via ORAL
  Filled 2016-03-30 (×5): qty 1

## 2016-03-30 MED ORDER — SODIUM CHLORIDE 0.9% FLUSH: 3.0000 mL | INTRAVENOUS | Status: DC | PRN

## 2016-03-30 MED ORDER — DIPHENHYDRAMINE HCL 25 MG PO CAPS: 25.0000 mg | ORAL_CAPSULE | Freq: Four times a day (QID) | ORAL | Status: DC | PRN

## 2016-03-30 MED ORDER — SIMETHICONE 80 MG PO CHEW
80.0000 mg | CHEWABLE_TABLET | ORAL | Status: DC
  Administered 2016-03-30 – 2016-03-31 (×2): 80 mg via ORAL
  Filled 2016-03-30 (×2): qty 1

## 2016-03-30 MED ORDER — PROMETHAZINE HCL 25 MG/ML IJ SOLN: 6.2500 mg | INTRAMUSCULAR | Status: DC | PRN

## 2016-03-30 MED ORDER — WITCH HAZEL-GLYCERIN EX PADS: 1.0000 "application " | MEDICATED_PAD | CUTANEOUS | Status: DC | PRN

## 2016-03-30 MED ORDER — LACTATED RINGERS IV SOLN
INTRAVENOUS | Status: DC
  Administered 2016-03-30: 10:00:00 via INTRAVENOUS

## 2016-03-30 MED ORDER — FENTANYL CITRATE (PF) 100 MCG/2ML IJ SOLN
INTRAMUSCULAR | Status: DC | PRN
  Administered 2016-03-30: 20 ug via INTRATHECAL

## 2016-03-30 MED ORDER — OXYTOCIN 10 UNIT/ML IJ SOLN
INTRAVENOUS | Status: DC | PRN
  Administered 2016-03-30: 40 [IU] via INTRAVENOUS

## 2016-03-30 MED ORDER — NALBUPHINE HCL 10 MG/ML IJ SOLN: 5.0000 mg | Freq: Once | INTRAMUSCULAR | Status: DC | PRN

## 2016-03-30 MED ORDER — LACTATED RINGERS IV SOLN
INTRAVENOUS | Status: DC
  Administered 2016-03-30: 125 mL/h via INTRAVENOUS

## 2016-03-30 MED ORDER — SCOPOLAMINE 1 MG/3DAYS TD PT72
1.0000 | MEDICATED_PATCH | Freq: Once | TRANSDERMAL | Status: DC
  Administered 2016-03-30: 1.5 mg via TRANSDERMAL
  Filled 2016-03-30: qty 1

## 2016-03-30 MED ORDER — SENNOSIDES-DOCUSATE SODIUM 8.6-50 MG PO TABS
2.0000 | ORAL_TABLET | ORAL | Status: DC
  Administered 2016-03-30 – 2016-03-31 (×2): 2 via ORAL
  Filled 2016-03-30 (×2): qty 2

## 2016-03-30 MED ORDER — ONDANSETRON HCL 4 MG/2ML IJ SOLN
INTRAMUSCULAR | Status: AC
  Filled 2016-03-30: qty 2

## 2016-03-30 MED ORDER — MORPHINE SULFATE (PF) 0.5 MG/ML IJ SOLN
INTRAMUSCULAR | Status: AC
  Filled 2016-03-30: qty 10

## 2016-03-30 MED ORDER — OXYTOCIN 40 UNITS IN LACTATED RINGERS INFUSION - SIMPLE MED: 2.5000 [IU]/h | INTRAVENOUS | Status: AC

## 2016-03-30 MED ORDER — MEPERIDINE HCL 25 MG/ML IJ SOLN: 6.2500 mg | INTRAMUSCULAR | Status: DC | PRN

## 2016-03-30 MED ORDER — COCONUT OIL OIL: 1.0000 "application " | TOPICAL_OIL | Status: DC | PRN

## 2016-03-30 MED ORDER — DIPHENHYDRAMINE HCL 50 MG/ML IJ SOLN
INTRAMUSCULAR | Status: AC
  Filled 2016-03-30: qty 1

## 2016-03-30 MED ORDER — FENTANYL CITRATE (PF) 100 MCG/2ML IJ SOLN
INTRAMUSCULAR | Status: AC
  Filled 2016-03-30: qty 2

## 2016-03-30 MED ORDER — ACETAMINOPHEN 500 MG PO TABS
1000.0000 mg | ORAL_TABLET | Freq: Four times a day (QID) | ORAL | Status: AC
  Administered 2016-03-30 – 2016-03-31 (×3): 1000 mg via ORAL
  Filled 2016-03-30 (×3): qty 2

## 2016-03-30 MED ORDER — NALBUPHINE HCL 10 MG/ML IJ SOLN: 5.0000 mg | INTRAMUSCULAR | Status: DC | PRN

## 2016-03-30 MED ORDER — BUPIVACAINE IN DEXTROSE 0.75-8.25 % IT SOLN
INTRATHECAL | Status: DC | PRN
  Administered 2016-03-30: 1.4 mL via INTRATHECAL

## 2016-03-30 MED ORDER — CEFAZOLIN SODIUM-DEXTROSE 2-4 GM/100ML-% IV SOLN
2.0000 g | INTRAVENOUS | Status: AC
  Administered 2016-03-30: 2 g via INTRAVENOUS
  Filled 2016-03-30: qty 100

## 2016-03-30 MED ORDER — ZOLPIDEM TARTRATE 5 MG PO TABS: 5.0000 mg | ORAL_TABLET | Freq: Every evening | ORAL | Status: DC | PRN

## 2016-03-30 MED ORDER — ERYTHROMYCIN 5 MG/GM OP OINT
TOPICAL_OINTMENT | OPHTHALMIC | Status: AC
  Filled 2016-03-30: qty 1

## 2016-03-30 MED ORDER — MENTHOL 3 MG MT LOZG: 1.0000 | LOZENGE | OROMUCOSAL | Status: DC | PRN

## 2016-03-30 MED ORDER — SODIUM CHLORIDE 0.9 % IR SOLN
Status: DC | PRN
  Administered 2016-03-30: 1000 mL

## 2016-03-30 MED ORDER — DIPHENHYDRAMINE HCL 25 MG PO CAPS: 25.0000 mg | ORAL_CAPSULE | ORAL | Status: DC | PRN

## 2016-03-30 MED ORDER — ONDANSETRON HCL 4 MG/2ML IJ SOLN
INTRAMUSCULAR | Status: DC | PRN
  Administered 2016-03-30: 4 mg via INTRAVENOUS

## 2016-03-30 MED ORDER — PHENYLEPHRINE 8 MG IN D5W 100 ML (0.08MG/ML) PREMIX OPTIME
INJECTION | INTRAVENOUS | Status: DC | PRN
  Administered 2016-03-30: 60 ug/min via INTRAVENOUS

## 2016-03-30 MED ORDER — IBUPROFEN 600 MG PO TABS
600.0000 mg | ORAL_TABLET | Freq: Four times a day (QID) | ORAL | Status: DC
  Administered 2016-03-30 – 2016-04-01 (×8): 600 mg via ORAL
  Filled 2016-03-30 (×8): qty 1

## 2016-03-30 MED ORDER — HYDROMORPHONE HCL 1 MG/ML IJ SOLN: 0.2500 mg | INTRAMUSCULAR | Status: DC | PRN

## 2016-03-30 MED ORDER — TETANUS-DIPHTH-ACELL PERTUSSIS 5-2.5-18.5 LF-MCG/0.5 IM SUSP: 0.5000 mL | Freq: Once | INTRAMUSCULAR | Status: DC

## 2016-03-30 MED ORDER — OXYTOCIN 10 UNIT/ML IJ SOLN
INTRAMUSCULAR | Status: AC
  Filled 2016-03-30: qty 4

## 2016-03-30 MED ORDER — KETOROLAC TROMETHAMINE 30 MG/ML IJ SOLN: 30.0000 mg | Freq: Once | INTRAMUSCULAR | Status: DC

## 2016-03-30 MED ORDER — DIPHENHYDRAMINE HCL 50 MG/ML IJ SOLN: 12.5000 mg | INTRAMUSCULAR | Status: DC | PRN

## 2016-03-30 MED ORDER — OXYCODONE HCL 5 MG PO TABS
5.0000 mg | ORAL_TABLET | ORAL | Status: DC | PRN
  Administered 2016-03-31: 5 mg via ORAL
  Filled 2016-03-30: qty 1

## 2016-03-30 MED ORDER — ACETAMINOPHEN 325 MG PO TABS: 650.0000 mg | ORAL_TABLET | ORAL | Status: DC | PRN

## 2016-03-30 MED ORDER — DIPHENHYDRAMINE HCL 50 MG/ML IJ SOLN
INTRAMUSCULAR | Status: DC | PRN
  Administered 2016-03-30: 25 mg via INTRAVENOUS

## 2016-03-30 MED ORDER — LACTATED RINGERS IV SOLN
125.0000 mL/h | INTRAVENOUS | Status: DC
  Administered 2016-03-30: 125 mL/h via INTRAVENOUS
  Administered 2016-03-30: 09:00:00 via INTRAVENOUS

## 2016-03-30 MED ORDER — PRENATAL MULTIVITAMIN CH
1.0000 | ORAL_TABLET | Freq: Every day | ORAL | Status: DC
Start: 2016-03-30 — End: 2016-04-01
  Administered 2016-03-31 – 2016-04-01 (×2): 1 via ORAL
  Filled 2016-03-30 (×2): qty 1

## 2016-03-30 MED ORDER — MORPHINE SULFATE (PF) 0.5 MG/ML IJ SOLN
INTRAMUSCULAR | Status: DC | PRN
  Administered 2016-03-30: .2 mg via INTRATHECAL

## 2016-03-30 MED ORDER — SCOPOLAMINE 1 MG/3DAYS TD PT72: 1.0000 | MEDICATED_PATCH | Freq: Once | TRANSDERMAL | Status: DC

## 2016-03-30 MED ORDER — OXYCODONE HCL 5 MG PO TABS
10.0000 mg | ORAL_TABLET | ORAL | Status: DC | PRN
  Administered 2016-03-31: 10 mg via ORAL
  Filled 2016-03-30: qty 2

## 2016-03-30 SURGICAL SUPPLY — 33 items
APL SKNCLS STERI-STRIP NONHPOA (GAUZE/BANDAGES/DRESSINGS) ×1
BENZOIN TINCTURE PRP APPL 2/3 (GAUZE/BANDAGES/DRESSINGS) ×3 IMPLANT
CLAMP CORD UMBIL (MISCELLANEOUS) ×3 IMPLANT
CLIP FILSHIE TUBAL LIGA STRL (Clip) ×3 IMPLANT
CLOSURE WOUND 1/2 X4 (GAUZE/BANDAGES/DRESSINGS) ×1
CLOTH BEACON ORANGE TIMEOUT ST (SAFETY) ×3 IMPLANT
DRSG OPSITE POSTOP 4X10 (GAUZE/BANDAGES/DRESSINGS) ×3 IMPLANT
DURAPREP 26ML APPLICATOR (WOUND CARE) ×3 IMPLANT
ELECT REM PT RETURN 9FT ADLT (ELECTROSURGICAL) ×3
ELECTRODE REM PT RTRN 9FT ADLT (ELECTROSURGICAL) ×1 IMPLANT
GLOVE BIOGEL PI IND STRL 7.0 (GLOVE) ×1 IMPLANT
GLOVE BIOGEL PI IND STRL 7.5 (GLOVE) ×1 IMPLANT
GLOVE BIOGEL PI INDICATOR 7.0 (GLOVE) ×2
GLOVE BIOGEL PI INDICATOR 7.5 (GLOVE) ×2
GLOVE ECLIPSE 7.5 STRL STRAW (GLOVE) ×3 IMPLANT
GOWN STRL REUS W/TWL LRG LVL3 (GOWN DISPOSABLE) ×9 IMPLANT
NS IRRIG 1000ML POUR BTL (IV SOLUTION) ×3 IMPLANT
PACK C SECTION WH (CUSTOM PROCEDURE TRAY) ×3 IMPLANT
PAD OB MATERNITY 4.3X12.25 (PERSONAL CARE ITEMS) ×3 IMPLANT
PENCIL SMOKE EVAC W/HOLSTER (ELECTROSURGICAL) ×6 IMPLANT
RETAINER VISCERAL (MISCELLANEOUS) ×2 IMPLANT
RETRACTOR TRAXI PANNICULUS (MISCELLANEOUS) ×1 IMPLANT
RTRCTR C-SECT PINK 25CM LRG (MISCELLANEOUS) ×3 IMPLANT
STRIP CLOSURE SKIN 1/2X4 (GAUZE/BANDAGES/DRESSINGS) ×2 IMPLANT
SUT PDS AB 0 CTX 60 (SUTURE) ×3 IMPLANT
SUT VIC AB 0 CTX 36 (SUTURE) ×9
SUT VIC AB 0 CTX36XBRD ANBCTRL (SUTURE) ×3 IMPLANT
SUT VIC AB 2-0 CT1 27 (SUTURE) ×3
SUT VIC AB 2-0 CT1 TAPERPNT 27 (SUTURE) ×1 IMPLANT
SUT VIC AB 4-0 KS 27 (SUTURE) ×3 IMPLANT
TOWEL OR 17X24 6PK STRL BLUE (TOWEL DISPOSABLE) ×3 IMPLANT
TRAXI PANNICULUS RETRACTOR (MISCELLANEOUS) ×2
TRAY FOLEY CATH SILVER 14FR (SET/KITS/TRAYS/PACK) ×3 IMPLANT

## 2016-03-30 NOTE — Anesthesia Preprocedure Evaluation (Signed)
Anesthesia Evaluation  Patient identified by MRN, date of birth, ID band Patient awake    Reviewed: Allergy & Precautions, H&P , NPO status , Patient's Chart, lab work & pertinent test results  Airway Mallampati: II  TM Distance: >3 FB Neck ROM: full    Dental no notable dental hx.    Pulmonary neg pulmonary ROS, former smoker,    Pulmonary exam normal        Cardiovascular negative cardio ROS Normal cardiovascular exam     Neuro/Psych negative neurological ROS  negative psych ROS   GI/Hepatic Neg liver ROS,   Endo/Other  diabetes, Gestational, Oral Hypoglycemic Agents  Renal/GU negative Renal ROS     Musculoskeletal   Abdominal (+) + obese,   Peds  Hematology negative hematology ROS (+)   Anesthesia Other Findings   Reproductive/Obstetrics (+) Pregnancy                             Anesthesia Physical Anesthesia Plan  ASA: II  Anesthesia Plan: Spinal   Post-op Pain Management:    Induction:   Airway Management Planned:   Additional Equipment:   Intra-op Plan:   Post-operative Plan:   Informed Consent: I have reviewed the patients History and Physical, chart, labs and discussed the procedure including the risks, benefits and alternatives for the proposed anesthesia with the patient or authorized representative who has indicated his/her understanding and acceptance.     Plan Discussed with: CRNA and Surgeon  Anesthesia Plan Comments:         Anesthesia Quick Evaluation

## 2016-03-30 NOTE — Transfer of Care (Signed)
Immediate Anesthesia Transfer of Care Note  Patient: Kristina Campbell  Procedure(s) Performed: Procedure(s): REPEAT CESAREAN SECTION WITH BILATERAL TUBAL LIGATION (Bilateral)  Patient Location: PACU  Anesthesia Type:Spinal  Level of Consciousness: awake, alert  and oriented  Airway & Oxygen Therapy: Patient Spontanous Breathing  Post-op Assessment: Report given to RN and Post -op Vital signs reviewed and stable  Post vital signs: Reviewed and stable  Last Vitals:  Vitals:   03/30/16 0812  BP: 132/73  Pulse: 92  Resp: 18  Temp: 36.9 C    Last Pain:  Vitals:   03/30/16 0812  TempSrc: Oral         Complications: No apparent anesthesia complications

## 2016-03-30 NOTE — Op Note (Signed)
Cesarean Section Operative Report  Kristina Campbell  03/30/2016  Indications: Scheduled Proceedure/Maternal Request   Pre-operative Diagnosis:  5th Repeat C Section and undesired fertility.   Post-operative Diagnosis: Same   Surgeon: Surgeon(s) and Role:    * Levie HeritageJacob J Stinson, DO - Primary    * Kathrynn RunningNoah Bedford Erine Phenix, MD - Assisting   Attending Attestation: I was present and scrubbed for the entire procedure.   Anesthesia: spinal    Estimated Blood Loss: 650 ml  Total IV Fluids: 2400 ml LR  Urine Output:: 600 ml clear yellow urine  Specimens: none  Findings: Viable female infant in cephalic presentation; Apgars pending; weight pending g; arterial cord pH not obtained; meconium-stained amniotic fluid; intact placenta with three vessel cord; normal uterus, fallopian tubes and ovaries bilaterally. Mild omental adhesions.  Baby condition / location:  Couplet care / Skin to Skin   Complications: no complications  Indications: Kristina Campbell is a 31 y.o. [id number] with an IUP 4450w2d presenting for scheduled repeat c/s. Also with a2/bdm, polyhydramnios, and suspected macrosomia.  The risks, benefits, complications, treatment options, and exected outcomes were discussed with the patient . The patient dwith the proposed plan, giving informed consent. identified as Kristina Campbell and the procedure verified as C-Section Delivery.  Procedure Details:  The patient was taken back to the operative suite where spinal anesthesia was placed.  A time out was held and the above information confirmed.   After induction of anesthesia, the patient was draped and prepped in the usual sterile manner and placed in a dorsal supine position with a leftward tilt. A Pfannenstiel incision was made and carried down through the subcutaneous tissue to the fascia. Fascial incision was made and sharply extended transversely. The fascia was separated from the underlying rectus tissue  superiorly and inferiorly. The peritoneum was identified and bluntly entered and extended longitudinally. One strand of omentum was bisected and each end tied with 0 vicryl. Alexis retractor was placed. A low transverse uterine incision was made and extended bluntly. Delivered from cephalic presentation was a viable infant with Apgars and weight as above.  After waiting 60 seconds for delayed cord cutting, the umbilical cord was clamped and cut cord blood was obtained for evaluation. Cord ph was not sent. The placenta was removed Intact and appeared normal. The uterine outline, tubes and ovaries appeared normal. The uterine incision was closed with running locked sutures of 0Vicryl in one layer.   Hemostasis was observed. The Fallopian tubes were identified bilaterally.  A Filshie clip was placed on each tube without difficulty 3 cm from the cornua.  There was no bleeding. The peritoneum was closed with 0 vicryl. The rectus muscles were examined and hemostasis observed. The fascia was then reapproximated with running sutures of 0 PDS. The subcuticular closure was performed using 2-0plain gut. The skin was closed with 4-0Vicryl. PICO device placed.  Instrument, sponge, and needle counts were correct prior the abdominal closure and were correct at the conclusion of the case.     Disposition: PACU - hemodynamically stable.   Maternal Condition: stable       Signed: Lavonne Chickoah B WoukMD 03/30/2016 10:54 AM

## 2016-03-30 NOTE — H&P (Signed)
Faculty Practice H&P  Kristina Campbell is a 31 y.o. female [phone] with IUP at [redacted]w[redacted]d presenting for elective cesarean section with tubal ligation due to previous cesarean x4. Pregnancy was been complicated by previous cesarean section, macrosomia, A2/B gestational diabetes.    Pt states she has been having no contractions, no vaginal bleeding, intact membranes, with normal fetal movement.     Prenatal Course Source of Care: [facility] office with onset of care at 18 weeks. She was initially evaluated at [facility] and was diagnosed with DM at 15 weeks Pregnancy complications or risks: Patient Active Problem List   Diagnosis Date Noted  . Hernia of abdominal wall 03/22/2016  . Macrosomia affecting management of mother 02/12/2016  . Language barrier 12/01/2015  . Supervision of high-risk pregnancy 11/03/2015  . A2/B Gestational diabetes mellitus, antepartum 11/03/2015  . Previous cesarean section x 4 complicating pregnancy 05/05/2012   She desires to have bilateral tubal ligation.  She plans to plans to breastfeed  Prenatal labs and studies: ABO, Rh: --/--/O POS (02/05 1030) Antibody: NEG (02/05 1030) Rubella: !Error! RPR: Non Reactive (02/05 1030)  HBsAg: Negative (08/24 0000)  HIV: NONREACTIVE (11/08 0850)  GBS:    Genetic screening normal Anatomy US normal  Past Medical History:  Past Medical History:  Diagnosis Date  . GERD (gastroesophageal reflux disease)    pt. states she has reflux and takes Zantac-last taken yesterday  . Gestational diabetes     Past Surgical History:  Past Surgical History:  Procedure Laterality Date  . CESAREAN SECTION    . CESAREAN SECTION     x3  . CESAREAN SECTION N/A 05/05/2012   Procedure: CESAREAN SECTION;  Surgeon: Adam Phenix, MD;  Location: [facility];  Service: Obstetrics;  Laterality: N/A;  Repeat  . CESAREAN SECTION      Obstetrical History:  OB History    Gravida Para Term Preterm AB Living   5 4 4  0 0 4   SAB TAB Ectopic  Multiple Live Births   0 0 0 0 4      Gynecological History:  OB History    Gravida Para Term Preterm AB Living   5 4 4  0 0 4   SAB TAB Ectopic Multiple Live Births   0 0 0 0 4      Social History:  Social History   Social History  . Marital status: Single    Spouse name: N/A  . Number of children: N/A  . Years of education: N/A   Social History Main Topics  . Smoking status: Former Smoker    Types: Cigarettes    Quit date: 11/03/2010  . Smokeless tobacco: Never Used  . Alcohol use No  . Drug use: No  . Sexual activity: Yes   Other Topics Concern  . None   Social History Narrative  . None    Family History: History reviewed. No pertinent family history.  Medications:  Prenatal vitamins,  Current Facility-Administered Medications  Medication Dose Route Frequency Provider Last Rate Last Dose  . ceFAZolin (ANCEF) IVPB 2g/100 mL premix  2 g Intravenous On Call to OR Rhona Raider Keidrick Murty, DO      . ketorolac (TORADOL) 30 MG/ML injection 30 mg  30 mg Intravenous Q6H PRN Leilani Able, MD       Or  . ketorolac (TORADOL) 30 MG/ML injection 30 mg  30 mg Intramuscular Q6H PRN Leilani Able, MD      . lactated ringers infusion  125 mL/hr Intravenous Continuous  Levie HeritageJacob J Finn Altemose, DO 125 mL/hr at 03/30/16 [id number] 125 mL/hr at 03/30/16 0829  . lactated ringers infusion   Intravenous Continuous Leilani AbleFranklin Hatchett, MD      . scopolamine (TRANSDERM-SCOP) 1 MG/3DAYS 1.5 mg  1 patch Transdermal Once Leilani AbleFranklin Hatchett, MD   1.5 mg at 03/30/16 0904    Allergies: No Known Allergies  Review of Systems: - negative  Physical Exam: Blood pressure 132/73, pulse 92, temperature 98.4 F (36.9 C), temperature source Oral, resp. rate 18, last menstrual period 06/19/2015, unknown if currently breastfeeding. GENERAL: Well-developed, well-nourished female in no acute distress.  LUNGS: Clear to auscultation bilaterally.  HEART: Regular rate and rhythm. ABDOMEN: Soft, nontender, nondistended,  gravid. EFW 10 lbs EXTREMITIES: Nontender, no edema, 2+ distal pulses.    Presentation: cephalic FHT:  Baseline rate 125 bpm      Pertinent Labs/Studies:   CBC    Component Value Date/Time   WBC 9.6 03/29/2016 1030   RBC 4.39 03/29/2016 1030   HGB 14.1 03/29/2016 1030   HCT 41.2 03/29/2016 1030   PLT 169 03/29/2016 1030   MCV 93.8 03/29/2016 1030   MCH 32.1 03/29/2016 1030   MCHC 34.2 03/29/2016 1030   RDW 14.4 03/29/2016 1030     Assessment : Kristina Campbell is a 31 y.o. G5P4004 at 3921w2d being admitted for cesarean section with BTL secondary to elective repeat  Plan: The risks of cesarean section discussed with the patient included but were not limited to: bleeding which may require transfusion or reoperation; infection which may require antibiotics; injury to bowel, bladder, ureters or other surrounding organs; injury to the fetus; need for additional procedures including hysterectomy in the event of a life-threatening hemorrhage; placental abnormalities wth subsequent pregnancies, incisional problems, thromboembolic phenomenon and other postoperative/anesthesia complications. The patient concurred with the proposed plan, giving informed written consent for the procedure.   Patient has been NPO since last night and will remain NPO for procedure.  Preoperative prophylactic Ancef ordered on call to the OR.    Levie HeritageJacob J Jacqulene Huntley, DO 03/30/2016, 9:06 AM

## 2016-03-30 NOTE — Progress Notes (Signed)
  Prenatal Transfer Tool  Maternal Diabetes: Yes:  Diabetes Type:  Insulin/Medication controlled Genetic Screening: Normal Maternal Ultrasounds/Referrals: Normal Fetal Ultrasounds or other Referrals:  Fetal echo Maternal Substance Abuse:  No Significant Maternal Medications:  None Significant Maternal Lab Results: None   Adam PhenixJames G Arnold, MD 03/30/2016

## 2016-03-30 NOTE — Anesthesia Postprocedure Evaluation (Signed)
Anesthesia Post Note  Patient: Kristina Campbell  Procedure(s) Performed: Procedure(s) (LRB): REPEAT CESAREAN SECTION WITH BILATERAL TUBAL LIGATION (Bilateral)  Patient location during evaluation: PACU Anesthesia Type: Spinal Level of consciousness: awake Pain management: pain level controlled Vital Signs Assessment: post-procedure vital signs reviewed and stable Respiratory status: spontaneous breathing Cardiovascular status: stable Postop Assessment: no headache, no backache, spinal receding, no signs of nausea or vomiting and patient able to bend at knees Anesthetic complications: no        Last Vitals:  Vitals:   03/30/16 1145 03/30/16 1200  BP: 113/74 120/76  Pulse: 82 70  Resp: (!) 27 10  Temp:      Last Pain:  Vitals:   03/30/16 1200  TempSrc:   PainSc: 8    Pain Goal:                 Unique Searfoss JR,JOHN Sricharan Lacomb

## 2016-03-30 NOTE — Anesthesia Procedure Notes (Signed)
Spinal  Patient location during procedure: OR Start time: 03/30/2016 9:25 AM End time: 03/30/2016 9:29 AM Staffing Anesthesiologist: Leilani AbleHATCHETT, Dahl Higinbotham Performed: anesthesiologist  Preanesthetic Checklist Completed: patient identified, surgical consent, pre-op evaluation, timeout performed, IV checked, risks and benefits discussed and monitors and equipment checked Spinal Block Patient position: sitting Prep: site prepped and draped and DuraPrep Patient monitoring: heart rate, cardiac monitor, continuous pulse ox and blood pressure Approach: midline Location: L3-4 Injection technique: single-shot Needle Needle type: Sprotte  Needle gauge: 24 G Needle length: 9 cm Needle insertion depth: 6 cm Assessment Sensory level: T4

## 2016-03-31 DIAGNOSIS — O24429 Gestational diabetes mellitus in childbirth, unspecified control: Secondary | ICD-10-CM

## 2016-03-31 DIAGNOSIS — O34211 Maternal care for low transverse scar from previous cesarean delivery: Secondary | ICD-10-CM

## 2016-03-31 DIAGNOSIS — Z3A29 29 weeks gestation of pregnancy: Secondary | ICD-10-CM

## 2016-03-31 LAB — CBC
HEMATOCRIT: 33.1 % — AB (ref 36.0–46.0)
HEMOGLOBIN: 11.4 g/dL — AB (ref 12.0–15.0)
MCH: 32.5 pg (ref 26.0–34.0)
MCHC: 34.4 g/dL (ref 30.0–36.0)
MCV: 94.3 fL (ref 78.0–100.0)
Platelets: 121 10*3/uL — ABNORMAL LOW (ref 150–400)
RBC: 3.51 MIL/uL — ABNORMAL LOW (ref 3.87–5.11)
RDW: 14.6 % (ref 11.5–15.5)
WBC: 9.5 10*3/uL (ref 4.0–10.5)

## 2016-03-31 LAB — GLUCOSE, RANDOM: Glucose, Bld: 136 mg/dL — ABNORMAL HIGH (ref 65–99)

## 2016-03-31 LAB — BIRTH TISSUE RECOVERY COLLECTION (PLACENTA DONATION)

## 2016-03-31 MED ORDER — GLYBURIDE 2.5 MG PO TABS: 2.5000 mg | ORAL_TABLET | Freq: Every day | ORAL | Status: DC

## 2016-03-31 MED ORDER — METFORMIN HCL 500 MG PO TABS
500.0000 mg | ORAL_TABLET | Freq: Two times a day (BID) | ORAL | Status: DC
  Administered 2016-03-31 – 2016-04-01 (×3): 500 mg via ORAL
  Filled 2016-03-31 (×5): qty 1

## 2016-03-31 NOTE — Progress Notes (Signed)
I check on patients needs, Ordered meals, by Orlan LeavensViria Alvarez Spanish Interpreter.

## 2016-03-31 NOTE — Addendum Note (Signed)
Addendum  created 03/31/16 1012 by Caili Escalera P Madalen Gavin, CRNA   Sign clinical note    

## 2016-03-31 NOTE — Lactation Note (Signed)
This note was copied from a baby's chart. Lactation Consultation Note Spanish speaking mom. FOB speaks [place]. Had Dexter to confirm FOB understands communication. Dexter interpret # [id number] assessment of BF and questions to mom. Hand good understanding of English between RN and FOB.  Mom BF her other children 3-4 months. Mom had to supplement w/formula d/t didn't have enough milk. Mom has "V" shaped breast w/everted nipples. Hand expressed w/slight glisten of colostrum. Mom stated she barely had any increase in breast during pregnancy. Youngest child is 554 yrs old. Mom gave formula at 2200 of 40 ml of formula. No formula given since. Feeding supplement sheet in Spanish given discussed supplementing amount. Mom stated baby hungry. Discussed size of baby's tummy and amount baby can hold.  Encouraged mom to use DEBP to induce lactation since had hx: of low milk supply. Mom refused, stated no she didn't want to use it. Encouraged mom to put baby to breast first before giving formula. Mom has given formula once. Encouraged to document I&O. Mom encouraged to feed baby 8-12 times/24 hours and with feeding cues. Discussed newborn behavior, encouraged STS. Mom had baby dressed in sleeper and hat. Mom had baby in football position. Denies questions for RN or LC. Encouraged to call if needed.  [facility] brochure given w/resources, support groups and LC services. Patient Name: Kristina Campbell Kristina Campbell's Date: 03/31/2016 Reason for consult: Initial assessment   Maternal Data Has patient been taught Hand Expression?: Yes Does the patient have breastfeeding experience prior to this delivery?: Yes  Feeding Feeding Type: Breast Fed Length of feed: 20 min  LATCH Score/Interventions Latch: Grasps breast easily, tongue down, lips flanged, rhythmical sucking.  Audible Swallowing: None  Type of Nipple: Everted at rest and after stimulation  Comfort (Breast/Nipple): Soft / non-tender     Hold  (Positioning): No assistance needed to correctly position infant at breast. Intervention(s): Breastfeeding basics reviewed;Support Pillows;Position options;Skin to skin  LATCH Score: 7  Lactation Tools Discussed/Used WIC Program: Yes   Consult Status Consult Status: Follow-up Date: 03/31/16 (in pm) Follow-up type: In-patient    Charyl DancerCARVER, Ettore Trebilcock G 03/31/2016, 4:47 AM

## 2016-03-31 NOTE — Anesthesia Postprocedure Evaluation (Addendum)
Anesthesia Post Note  Patient: Kristina Campbell  Procedure(s) Performed: Procedure(s) (LRB): REPEAT CESAREAN SECTION WITH BILATERAL TUBAL LIGATION (Bilateral)  Patient location during evaluation: Mother Baby Anesthesia Type: Spinal Level of consciousness: awake Pain management: pain level controlled Vital Signs Assessment: post-procedure vital signs reviewed and stable Respiratory status: spontaneous breathing Cardiovascular status: stable Postop Assessment: no headache, no backache and spinal receding Anesthetic complications: no        Last Vitals:  Vitals:   03/31/16 0230 03/31/16 0635  BP: 109/63 130/70  Pulse: 70 78  Resp: 18 20  Temp: 36.8 C 37 C    Last Pain:  Vitals:   03/31/16 0635  TempSrc:   PainSc: 0-No pain   Pain Goal:                 Edison PaceWILKERSON,VALERIE

## 2016-03-31 NOTE — Progress Notes (Signed)
POSTPARTUM PROGRESS NOTE  Post Op day #1 Subjective:  Kristina Campbell is a 31 y.o. [id number] 5935w3d s/p RLTCS/BTL  No acute events overnight.  Pt denies problems with ambulating, voiding or po intake.  She denies nausea or vomiting.  Pain is well controlled.  She has had flatus. She has not had bowel movement.  Lochia Minimal.   Objective: Blood pressure 130/70, pulse 78, temperature 98.6 F (37 C), resp. rate 20, last menstrual period 06/19/2015, SpO2 97 %, unknown if currently breastfeeding.  Physical Exam:  General: alert, cooperative and no distress Lochia:normal flow Chest: CTAB Heart: RRR no m/r/g Abdomen: +BS, soft, nontender,  Uterine Fundus: firm DVT Evaluation: No calf swelling or tenderness Extremities: no edema   Recent Labs  03/29/16 1030 03/31/16 0556  HGB 14.1 11.4*  HCT 41.2 33.1*    Assessment/Plan:  ASSESSMENT: Kristina Campbell is a 31 y.o. A5W0981G5P4004 1635w3d s/p RLTCS/BTL.  Doing well with PICO in place, clean dry and intact. Hemoglobin stable this AM.  Pre-prandial sugar 136 this AM.  Restarted metformin 500mg  BID with plans to increase to 1000g BID when outpatient.   Plan for discharge tomorrow   LOS: 1 day   Renne Muscaaniel L Warden, MD PGY-1 [facility]  03/31/2016, 9:41 AM    OB FELLOW POSTPARTUM PROGRESS NOTE ATTESTATION  I have seen and examined this patient and agree with above documentation in the resident's note.   Ernestina PennaNicholas Schenk, MD 6:03 PM

## 2016-04-01 ENCOUNTER — Encounter (HOSPITAL_COMMUNITY): Payer: Self-pay | Admitting: *Deleted

## 2016-04-01 LAB — GLUCOSE, RANDOM: GLUCOSE: 125 mg/dL — AB (ref 65–99)

## 2016-04-01 MED ORDER — DOCUSATE SODIUM 100 MG PO CAPS: 100.0000 mg | ORAL_CAPSULE | Freq: Two times a day (BID) | ORAL | 0 refills | Status: DC

## 2016-04-01 MED ORDER — IBUPROFEN 600 MG PO TABS: 600.0000 mg | ORAL_TABLET | Freq: Four times a day (QID) | ORAL | 0 refills | Status: DC

## 2016-04-01 MED ORDER — OXYCODONE HCL 5 MG PO TABS: 5.0000 mg | ORAL_TABLET | ORAL | 0 refills | Status: DC | PRN

## 2016-04-01 NOTE — Plan of Care (Signed)
Problem: Health Behavior/Discharge Planning: Goal: Ability to manage health-related needs will improve Outcome: Completed/Met Date Met: 04/01/16 Discharge education and paperwork discussed.  Reasons to call the MD reviewed.  Pt instructed to remove PICO dressing in 1 week and s/s of infection reviewed.  Encouraged significant other to aid in assessing site until pt able to visualize.  Care of incision discussed.   Pt and significant other verbalize understanding.  In house interpreter Eda used.

## 2016-04-01 NOTE — Discharge Summary (Signed)
OB Discharge Summary     Patient Name: Kristina Campbell DOB: [birth year] MRN: [id number]  Date of admission: 03/30/2016 Delivering MD: Levie Heritage   Date of discharge: 04/01/2016  Admitting diagnosis: cpt [phone] and 62130 - 5th Repeat C Section and undesired fertility Intrauterine pregnancy: [redacted]w[redacted]d     Secondary diagnosis:  Active Problems:   Previous cesarean section x 4 complicating pregnancy   A2/B Gestational diabetes mellitus, antepartum   Macrosomia affecting management of mother   Status post repeat low transverse cesarean section  Additional problems: GDMA2/B     Discharge diagnosis: Term Pregnancy Delivered                                                                                                Post partum procedures:postpartum tubal ligation  Augmentation: AROM  Complications: None  Hospital course:  Sceduled C/S   31 y.o. yo G5P4004 at [redacted]w[redacted]d was admitted to the hospital 03/30/2016 for scheduled cesarean section with the following indication:Elective Repeat.  Membrane Rupture Time/Date: 9:57 AM ,03/30/2016   Patient delivered a Viable infant.03/30/2016  Details of operation can be found in separate operative note.  Pateint had an uncomplicated postpartum course.  She is ambulating, tolerating a regular diet, passing flatus, and urinating well. Patient is discharged home in stable condition on  04/01/16         Physical exam  Vitals:   03/31/16 0635 03/31/16 0950 03/31/16 1755 04/01/16 0550  BP: 130/70 122/78 129/74 134/70  Pulse: 78 91 98 98  Resp: 20 20 18 18   Temp: 98.6 F (37 C) 97.9 F (36.6 C) 99.6 F (37.6 C) 98.6 F (37 C)  TempSrc:  Oral Oral Oral  SpO2: 97%      General: alert and cooperative Lochia: appropriate Uterine Fundus: firm Incision: Healing well with no significant drainage, No significant erythema, Dressing is clean, dry, and intact DVT Evaluation: No evidence of DVT seen on physical exam. Labs: Lab Results  Component  Value Date   WBC 9.5 03/31/2016   HGB 11.4 (L) 03/31/2016   HCT 33.1 (L) 03/31/2016   MCV 94.3 03/31/2016   PLT 121 (L) 03/31/2016   CMP Latest Ref Rng & Units 04/01/2016  Glucose 65 - 99 mg/dL 865(H)  BUN 6 - 20 mg/dL -  Creatinine 8.46 - 9.62 mg/dL -  Sodium 952 - 841 mmol/L -  Potassium 3.5 - 5.1 mmol/L -  Chloride 101 - 111 mmol/L -  CO2 22 - 32 mmol/L -  Calcium 8.9 - 10.3 mg/dL -  Total Protein 6.1 - 8.1 g/dL -  Total Bilirubin 0.2 - 1.2 mg/dL -  Alkaline Phos 33 - 324 U/L -  AST 10 - 30 U/L -  ALT 6 - 29 U/L -    Discharge instruction: per After Visit Summary and "Baby and Me Booklet".  After visit meds:  Allergies as of 04/01/2016   No Known Allergies     Medication List    STOP taking these medications   glyBURIDE 2.5 MG tablet Commonly known as:  DIABETA   PRENATAL VITAMIN PO  TAKE these medications   aspirin EC 81 MG tablet Take 1 tablet (81 mg total) by mouth daily. Take after 12 weeks for prevention of preeclampsia later in pregnancy   docusate sodium 100 MG capsule Commonly known as:  COLACE Take 1 capsule (100 mg total) by mouth 2 (two) times daily.   glucose blood test strip AM fasting and 2hour after each meal   ibuprofen 600 MG tablet Commonly known as:  ADVIL,MOTRIN Take 1 tablet (600 mg total) by mouth every 6 (six) hours.   metFORMIN 500 MG tablet Commonly known as:  GLUCOPHAGE Take 2 tablets (1,000 mg total) by mouth 2 (two) times daily. Take wit breakfast and dinner   oxyCODONE 5 MG immediate release tablet Commonly known as:  Oxy IR/ROXICODONE Take 1 tablet (5 mg total) by mouth every 4 (four) hours as needed (pain scale 4-7).   RELION ALCOHOL SWABS Pads 1 Units by Does not apply route as needed.   ZANTAC PO Take 1 tablet by mouth 2 (two) times daily as needed (HEARTBURN).       Diet: routine diet  Activity: Advance as tolerated. Pelvic rest for 6 weeks.   Outpatient follow up:6 weeks Follow up Appt:No future  appointments. Follow up Visit:No Follow-up on file.  Postpartum contraception: Tubal Ligation  Newborn Data: Live born female  Birth Weight: 10 lb 14.6 oz (4950 g) APGAR: 8, 9  Baby Feeding: Bottle Disposition:home with mother   04/01/2016 Renne Muscaaniel L Warden, MD  Patient was seen and examined by me also Agree with note Vitals stable Labs stable Fundus firm, lochia within normal limits Dressing clean and intact. Ext WNL  Ready for discharge  Aviva SignsMarie L Carlus Stay, CNM

## 2016-04-01 NOTE — Discharge Instructions (Signed)
Please continue taking metformin 500mg  twice daily. Please remove your dressing in   Parto por cesrea (Cesarean Delivery) El nacimiento o parto por cesrea es el parto quirrgico de un beb a travs de una incisin en el abdomen y Careers information officer. Se lo puede llamar incisin cesrea. Este procedimiento se puede programar con anticipacin o se puede Publishing copy una situacin de Associate Professor. INFORME A SU MDICO:  Cualquier alergia que tenga.  Todos los Walt Disney, incluidos vitaminas, hierbas, gotas oftlmicas, cremas y [address] de [address].  Problemas previos que usted o los [employer] de su familia hayan tenido con el uso de anestsicos.  Enfermedades de la sangre que tenga.  Si tiene cirugas previas.  Cualquier enfermedad que tenga.  Si usted o Research scientist (physical sciences) tiene antecedentes de trombosis venosa profunda (TVP) o embolia pulmonar (EP). RIESGOS Y COMPLICACIONES En general, se trata de un procedimiento seguro. Sin embargo, pueden ocurrir complicaciones, por ejemplo:  Infeccin.  Hemorragia.  Reacciones alrgicas a los medicamentos.  Daos a Systems developer u otros rganos.  Cogulos sanguneos.  Lesiones al beb. ANTES DEL PROCEDIMIENTO  Siga las indicaciones del mdico respecto de las restricciones para las comidas o las bebidas.  Siga las indicaciones del mdico respecto de baarse antes del procedimiento para ayudar a reducir el riesgo de infeccin.  Si sabe que va a tener un parto por cesrea, no se rasure la zona pbica. Rasurarse antes del procedimiento puede aumentar el riesgo de infeccin.  Consulte al mdico si debe hacer o no lo siguiente:  Cambiar o suspender los medicamentos que toma habitualmente. Esto es muy importante si toma medicamentos para la diabetes o anticoagulantes.  El plan para Human resources officer. Esto es muy importante si piensa amamantar a su beb.  Cunto Physicist, medical hospital despus del  procedimiento.  Cualquier inquietud que pueda tener sobre recibir hemoderivados si los necesita durante el procedimiento.  Almacenamiento de sangre del cordn, si planea guardar la sangre del cordn umbilical del beb.  Quiz tambin desee preguntarle al mdico lo siguiente:  Si va a poder Publishing rights manager a su beb mientras an se encuentre en el quirfano.  Si su beb puede quedarse con usted inmediatamente despus del procedimiento y durante su recuperacin.  Si un familiar o la persona que usted elija puede ingresar al quirfano y Personal assistant con usted durante el procedimiento, inmediatamente despus de este y durante su recuperacin.  Haga arreglos para que alguien la lleve a su casa cuando le den el alta hospitalaria. PROCEDIMIENTO  Se le colocarn monitores fetales en el abdomen para controlar la frecuencia cardaca de su beb y la suya.  Segn el motivo del parto por cesrea, es posible que se le realice un examen fsico o una prueba adicional, como una ecografa.  Le colocarn una va intravenosa (IV) en una de las venas.  Posiblemente le realicen un anlisis de [place] u [place].  Se le administrarn antibiticos para ayudar a prevenir las infecciones.  Se le puede entregar una bata especial de calentamiento para mantener estable la temperatura corporal.  Pueden rasurarle a zona pbica.  Le limpiarn la piel de la zona pbica y de la parte inferior del abdomen con una solucin para destruir las bacterias (antisptico).  Se le puede insertar un catter en la vejiga a travs de la uretra. Esto se hace para drenar la orina durante el procedimiento.  Pueden administrarle uno o ms de los siguientes medicamentos:  Un medicamento para adormecer la zona (anestesia local).  Un medicamento que lo har dormir (anestesia general).  Un medicamento (anestesia regional) que se le inyecta en la espalda o a travs de un tubo pequeo y delgado que se le coloca en la espalda  (anestesia raqudea o anestesia epidural). Esto adormece la parte del cuerpo que est por debajo del lugar de la inyeccin y Environmental consultantle permite permanecer despierta durante el procedimiento. Si llega a sentir nuseas, dgaselo al mdico. Tendr medicamentos disponibles para ayudarla a reducir cualquier nusea que pueda sentir.  Le harn una incisin en el abdomen y Duke Energyluego en el tero.  Si est despierta durante el procedimiento, puede sentir tirones en el abdomen, pero no debera Financial risk analystsentir dolor. Si siente dolor, dgaselo al mdico inmediatamente.  Se sacar al beb del tero. Es posible que sienta ms presin o un tirn, mientras esto sucede.  Inmediatamente despus del parto, se secar al beb y se lo mantendr caliente. Podr sostener y Museum/gallery exhibitions officeramamantar a su beb. Durante este momento, es posible que se pince y corte el cordn umbilical.  Se le extraer la placenta del tero.  Las incisiones se cerrarn con puntos (suturas). Es posible que se apliquen grapas, pegamento para la piel o tiras [phone] en la incisin del abdomen.  Se colocarn vendas (vendajes) sobre la incisin del abdomen. Este procedimiento puede variar segn el mdico y el hospital. DESPUS DEL PROCEDIMIENTO  Conley RollsLe controlarn con frecuencia la presin arterial, la frecuencia cardaca, la frecuencia respiratoria y Air cabin crewel nivel de oxgeno en la sangre hasta que haya desaparecido el efecto de los medicamentos administrados.  Puede seguir recibiendo lquidos o medicamentos por la va intravenosa.  Sentir un poco de Engineer, miningdolor. Tendr analgsicos disponibles para ayudarla a Human resources officercontrolar el dolor.  Para evitar la formacin de cogulos sanguneos:  Se le pueden administrar medicamentos.  Quizs deba usar medias o dispositivos de compresin.  Se le indicar que camine cuando pueda hacerlo.  El personal del hospital alentar y apoyar que cree lazos con su beb. En el hospital, se le puede permitir que el beb permanezca en la misma habitacin que usted  (internacin conjunta) durante la hospitalizacin para fomentar un amamantamiento exitoso.  Se le puede sugerir que tosa y respire profundamente con frecuencia. Esto ayuda a evitar problemas pulmonares.  Si tiene un catter Wal-Martque le drena la orina, se le quitar lo antes posible despus del procedimiento. Esta informacin no tiene Theme park managercomo fin reemplazar el consejo del mdico. Asegrese de hacerle al mdico cualquier pregunta que tenga. Document Released: 02/08/2005 Document Revised: 06/02/2015 Document Reviewed: 11/19/2014 Elsevier Interactive Patient Education  2017 [employer].

## 2016-04-01 NOTE — Progress Notes (Signed)
Pt taking Metformin 500mg  PO BID currently in hospital.  Meds to go home say Metformin 1000mg  PO BID.  Dr. Myrtie SomanWarden verifies that this is a correct order.

## 2016-04-01 NOTE — Lactation Note (Signed)
This note was copied from a baby's chart. Lactation Consultation Note  Patient Name: Boy Gwynneth AlimentYulisma Dominguez-Arteaga EAVWU'JToday's Date: 04/01/2016 Reason for consult: Follow-up assessment Pacific interpreter [id number] used for visit. Mom getting ready to give formula. LC asked Mom if she was BR/BO or just bottle feeding. Mom reports she is BF with some feedings and baby will stay on 5-15 minutes. Mom latched baby for about 5 minutes at this visit. Baby did not have good depth and came off breast, Mom gave formula via bottle. LC reviewed supply/demand if Mom wants to BF. Stressed importance of offering breast each feeding, at least 8 times/day before giving bottle. Mom reports history of LMS, she reports she BF for 3-4 months then switches to bottle. Discussed pump/bottle feeding. Mom reports she would [facility] declined. Gave Mom Harmony Hand pump and reviewed pumping every 3 hours for 15 minutes to encourage milk production and to have EBM to supplement. Reviewed supplemental guidelines with BF or bottlefeeding. Engorgement care reviewed. Advised of OP services and support group. Mom to call for questions/concerns.   Maternal Data    Feeding Feeding Type: Bottle Fed - Formula Nipple Type: Slow - flow  LATCH Score/Interventions                      Lactation Tools Discussed/Used     Consult Status Consult Status: Complete Date: 04/01/16 Follow-up type: In-patient    Alfred LevinsGranger, Crystalmarie Yasin Ann 04/01/2016, 12:14 PM

## 2016-05-11 ENCOUNTER — Ambulatory Visit (INDEPENDENT_AMBULATORY_CARE_PROVIDER_SITE_OTHER): Payer: Self-pay | Admitting: Advanced Practice Midwife

## 2016-05-11 ENCOUNTER — Encounter: Payer: Self-pay | Admitting: Advanced Practice Midwife

## 2016-05-11 DIAGNOSIS — O24415 Gestational diabetes mellitus in pregnancy, controlled by oral hypoglycemic drugs: Secondary | ICD-10-CM

## 2016-05-11 NOTE — Progress Notes (Signed)
error 

## 2016-05-11 NOTE — Patient Instructions (Signed)
Your next Pap smear is due in 2020.   Prueba de Papanicolaou (Pap Test) POR QU ME DEBO REALIZAR ESTA PRUEBA? A esta prueba tambin se la denomina "frotis de Pap". Es una prueba de deteccin que se [place] Islandsutiliza para Engineer, manufacturingdetectar signos de cncer de vagina, cuello del tero y tero. La prueba tambin puede identificar la presencia de infeccin o cambios precancerosos. El mdico probablemente le recomiende que se realice esta prueba en forma regular. Esta prueba puede realizarse de la siguiente manera:  Cada 3 aos, a partir de los [address] aos.  Cada 5 aos, en combinacin con las pruebas que se realizan para Landscape architectdetectar la presencia del virus del Geneticist, molecularpapiloma humano (VPH).  Con mayor o menor frecuencia, en funcin de otras enfermedades que tenga. QU TIPO DE MUESTRA SE TOMA? El mdico utilizar un pequeo hisopo de algodn, una esptula de plstico o un cepillo para Landscape architectrecolectar una muestra de clulas de la superficie del cuello del tero. El cuello del tero es la apertura del tero, que tambin se conoce como matriz. Tambin pueden recolectarse las secreciones del cuello del tero y la vagina. CMO DEBO PREPARARME PARA ESTA PRUEBA?  Tenga en cuenta en qu etapa del ciclo menstrual se encuentra. Es posible que Human resources officerdeba reprogramar la prueba si est Magazine features editormenstruando el da en que debe realizrsela.  Si el da en que debe realizarse la prueba tiene una infeccin vaginal aparente, deber reprogramar la prueba.  Pueden pedirle que evite tomar una ducha o bao el da de la prueba o el da anterior.  Algunos medicamentos pueden The ServiceMaster Companyprovocar resultados anormales de la prueba, como los digitlicos y Regulatory affairs officerla tetraciclina. Si toma alguno de Ford Motor Companyestos medicamentos, hable con su mdico antes de Smurfit-Stone Containerrealizarse la prueba. QU SIGNIFICAN LOS RESULTADOS? Los Cox Communicationsresultados anormales de la prueba pueden indicar diversas enfermedades. Estas pueden incluir lo siguiente:  Cncer. Si bien los resultados de la prueba de Papanicolaou no pueden utilizarse para  Consulting civil engineerdiagnosticar cncer de cuello del tero, de vagina o de tero, pueden indicar que existe una posibilidad de presencia de cncer. En Maurilio Lovelyeste caso, ser necesario realizar pruebas adicionales para determinar la presencia de cncer.  Enfermedad de transmisin sexual.  Infecciones por hongos.  Infeccin por parsitos.  Infeccin por herpes.  Una enfermedad que causa o favorece la infertilidad. Es su responsabilidad retirar el resultado del [place]aksestudio. Consulte en el laboratorio o en el departamento en el que fue realizado el estudio cundo y cmo podr Starbucks Corporationobtener los resultados. Comunquese con el mdico si tiene Nykerria Macconnell Internationalpreguntas sobre los resultados. Esta informacin no tiene Theme park managercomo fin reemplazar el consejo del mdico. Asegrese de hacerle al mdico cualquier pregunta que tenga. Document Released: 07/28/2007 Document Revised: 03/01/2014 Document Reviewed: 07/02/2013 Elsevier Interactive Patient Education  2017 Arvin[employer]Elsevier Inc.

## 2016-05-13 NOTE — Progress Notes (Signed)
Subjective:     Kristina Campbell is a 31 y.o. female who presents for a postpartum visit. She is 6 weeks postpartum following a low cervical transverse Cesarean section. I have fully reviewed the prenatal and intrapartum course. The delivery was at 39 gestational weeks. Outcome: repeat cesarean section, low transverse incision. Anesthesia: spinal. Postpartum course has been unremarkable. Baby's course has been unremarkable. Baby is feeding by bottle - Similac Advance. Bleeding staining only. Bowel function is normal. Bladder function is normal. Patient is not sexually active. Contraception method is tubal ligation. Postpartum depression screening: negative.  Spanish interpreter Cicero Duckrika present for visit.  The following portions of the patient's history were reviewed and updated as appropriate: allergies, current medications, past family history, past medical history, past social history, past surgical history and problem list.  Review of Systems Pertinent items are noted in HPI.   Objective:    There were no vitals taken for this visit.  General:  alert, cooperative and no distress   Breasts:  Declined  Lungs: clear to auscultation bilaterally  Heart:  regular rate and rhythm, S1, S2 normal, no murmur, click, rub or gallop  Abdomen: soft, non-tender; bowel sounds normal; no masses,  no organomegaly and incision healing well. 1.5 cm area at right end of incision that appears to have separated and healed by secondary intention. Still slightly sore. No erythema, swelling, drainage, warmth or bleeding.    Vulva:  not evaluated        Assessment:    Normal postpartum exam. Pap smear not done at today's visit.   Plan:    1. Contraception: tubal ligation 2. Pap smear due 2020 3. As soon as possible for postpartum GTT.

## 2016-05-25 ENCOUNTER — Other Ambulatory Visit: Payer: Self-pay

## 2016-05-25 DIAGNOSIS — O24414 Gestational diabetes mellitus in pregnancy, insulin controlled: Secondary | ICD-10-CM

## 2016-05-26 LAB — GLUCOSE TOLERANCE, 2 HOURS
GLUCOSE FASTING GTT: 103 mg/dL — AB (ref 65–99)
Glucose, 2 hour: 147 mg/dL — ABNORMAL HIGH (ref 65–139)

## 2016-06-01 ENCOUNTER — Telehealth: Payer: Self-pay | Admitting: *Deleted

## 2016-06-01 DIAGNOSIS — E119 Type 2 diabetes mellitus without complications: Secondary | ICD-10-CM

## 2016-06-01 NOTE — Telephone Encounter (Signed)
Per message from Dr. Adrian Blackwater need to call patient and notify of elevated 2 hour GTT and needs referral to Primary Care.    I called patient with [employer] [phone] and was unable to leave a message due to no voicemail set up.

## 2016-06-03 NOTE — Telephone Encounter (Signed)
Called patient with pacific interpreter [phone], no answer- unable to leave message. Will send letter and place referral in epic to [facility].

## 2016-07-30 NOTE — Addendum Note (Signed)
Addendum  created 07/30/16 0949 by Drevon Plog, MD   Sign clinical note    

## 2017-01-30 IMAGING — US US OB COMP LESS 14 WK
1 series · 15 of 28 positions shown · non-contrast
Comparison: None.

CLINICAL DATA: Patient with vaginal bleeding. Early pregnancy.
pregnant based on her last menstrual period.

EXAM:
OBSTETRIC <14 WK ULTRASOUND
TECHNIQUE: Transabdominal ultrasound was performed for evaluation of the
gestation as well as the maternal uterus and adnexal regions.

[Series 1: us ob comp less 14 wk · 45 acquisitions, 15 frames shown]
[im 1/45]
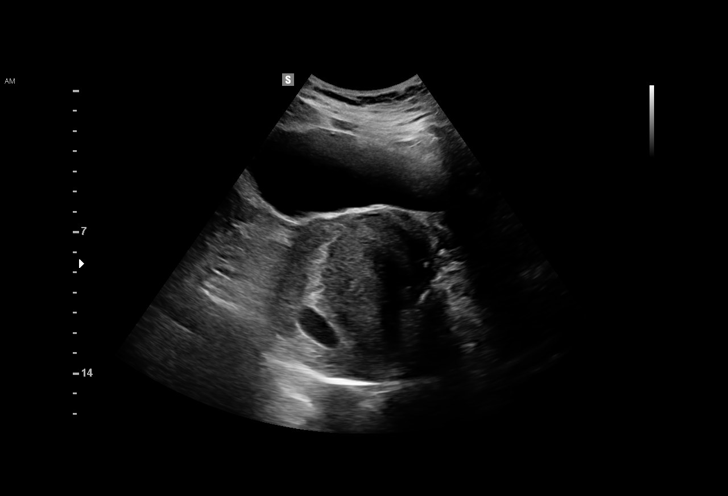
[im 4/45]
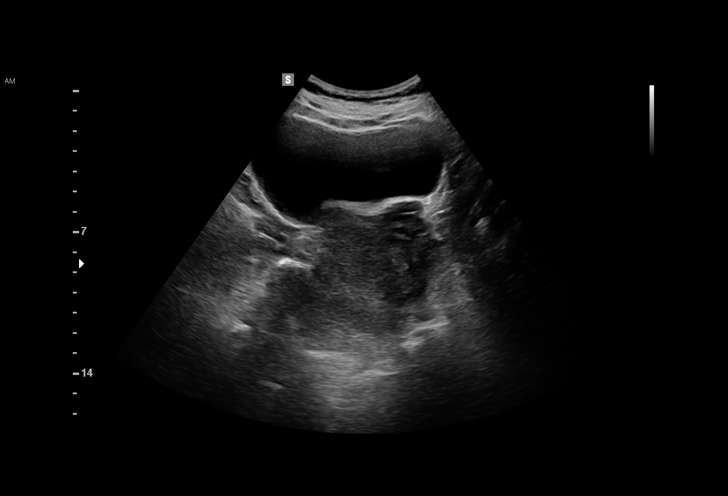
[im 7/45]
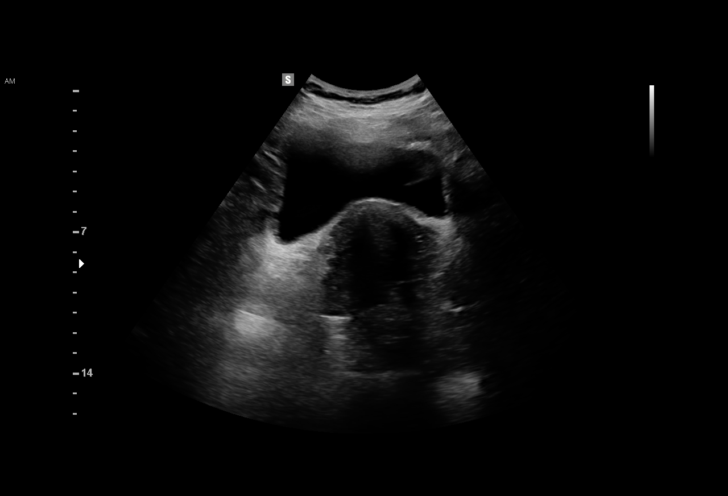
[im 10/45]
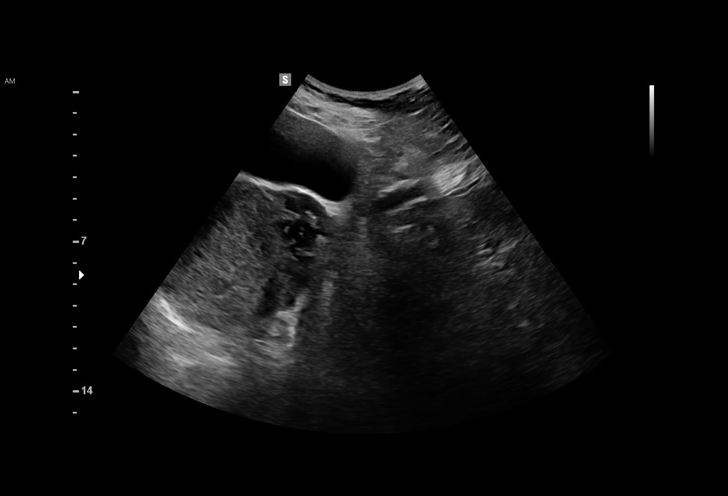
[im 14/45]
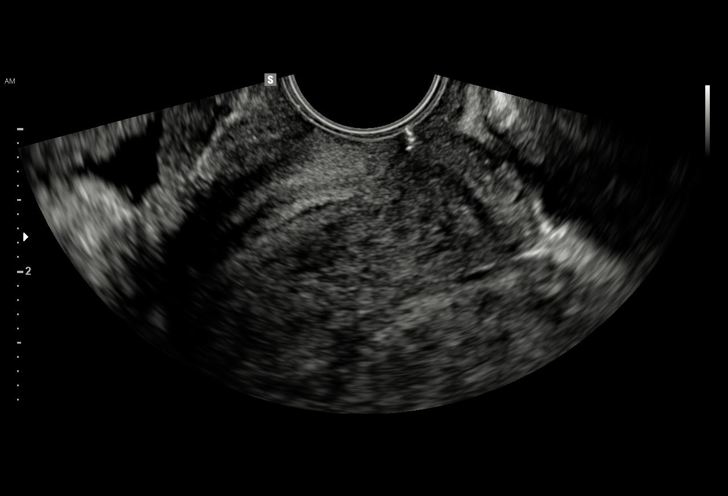
[im 17/45]
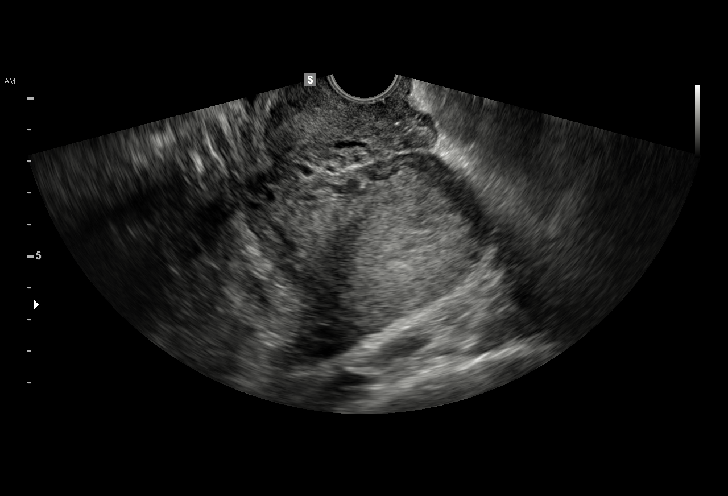
[im 20/45]
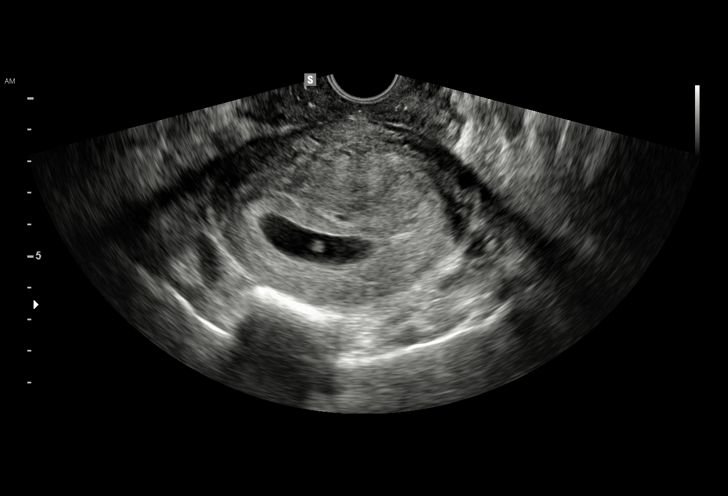
[im 23/45]
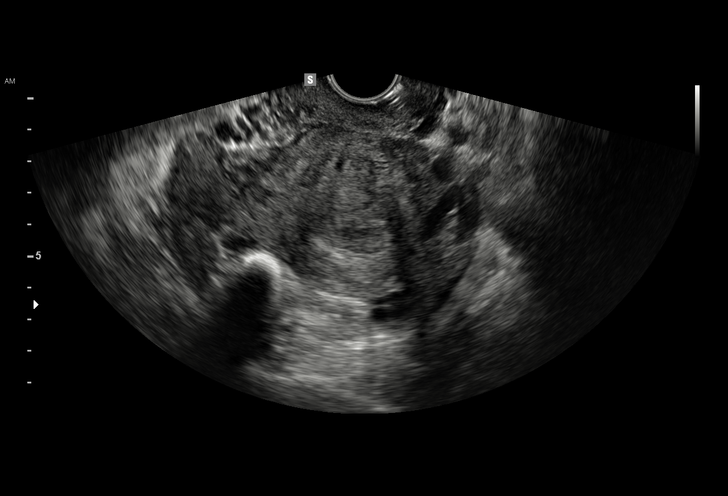
[im 25/45]
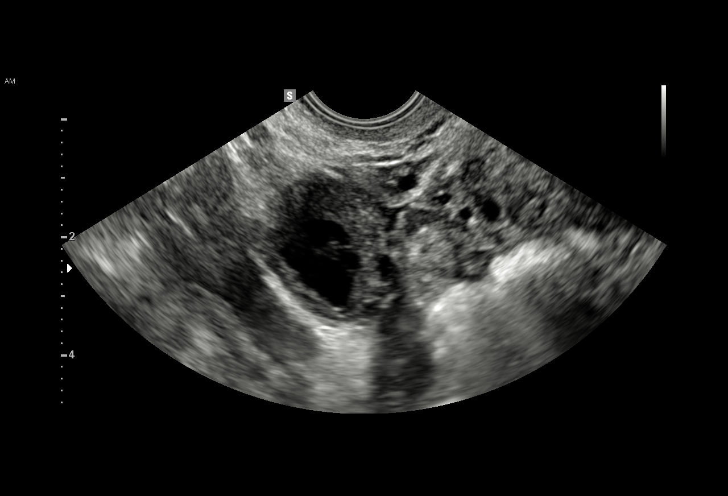
[im 28/45]
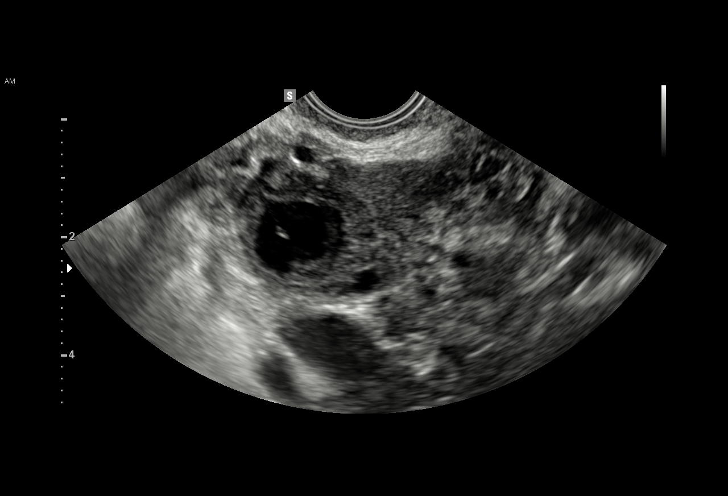
[im 31/45]
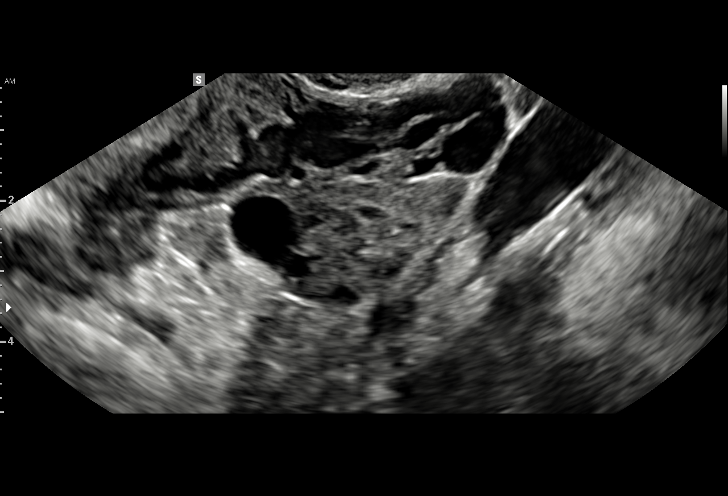
[im 35/45]
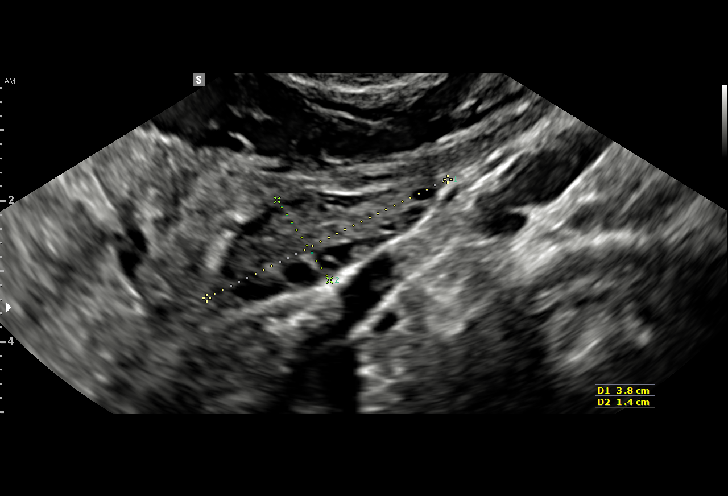
[im 38/45]
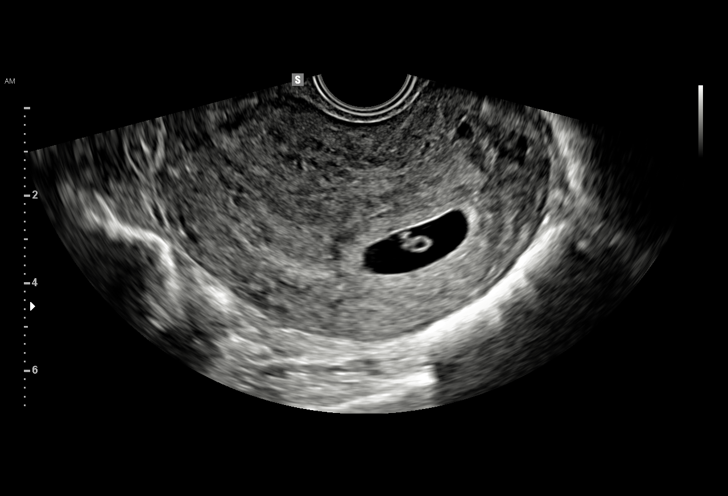
[im 41/45]
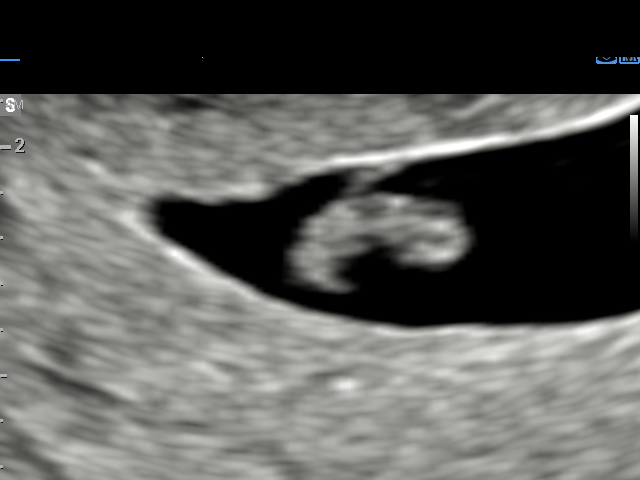
[im 45/45]
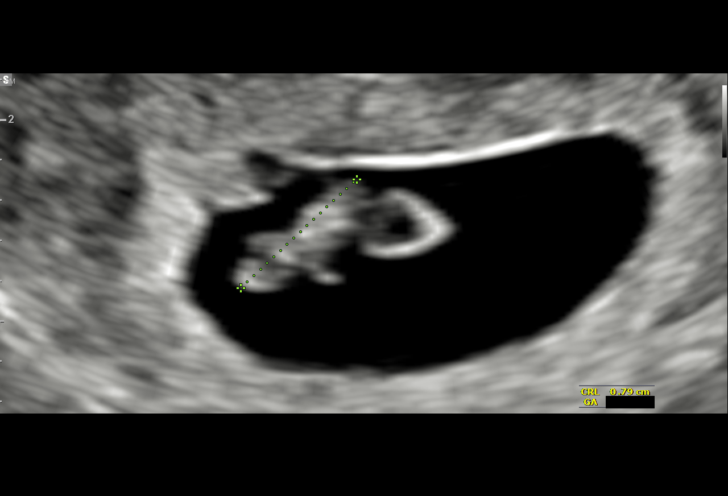

[15 of 28 positions shown; findings below may reference images not displayed]

FINDINGS: Intrauterine gestational sac: Yes

Yolk sac:  Yes

Embryo:  Yes

Cardiac Activity: Yes

Heart Rate: 130 bpm

CRL:   8  mm   6 w 5 d                  US EDC: 04/05/2015

Subchorionic hemorrhage:  None visualized.

Maternal uterus/adnexae: No uterine masses. Unremarkable ovaries. No
adnexal masses. No free fluid.
IMPRESSION: 1. Single live anterior pregnancy with a measured gestational age of
6 weeks and 5 days. No emergent pregnancy complication or maternal
abnormality.

## 2017-05-20 IMAGING — US US MFM OB FOLLOW-UP
1 series · 14 of 28 positions shown · non-contrast
Comparison: none

[Series 1: us mfm ob follow-up · 14 of 41 slices shown]
[im 2/41]
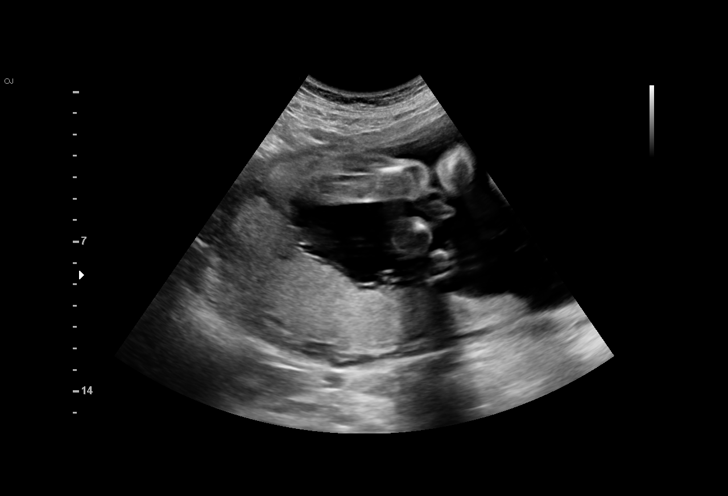
[im 5/41]
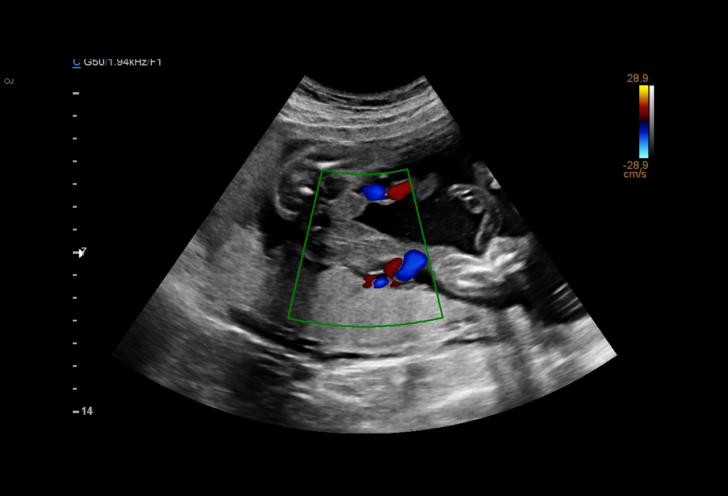
[im 8/41]
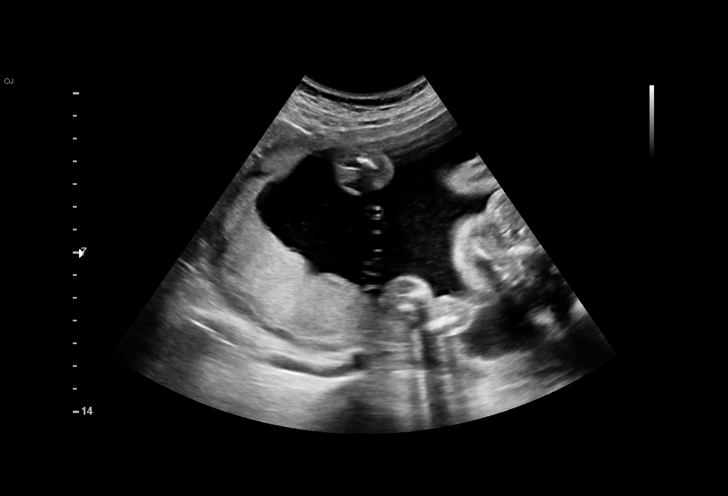
[im 11/41]
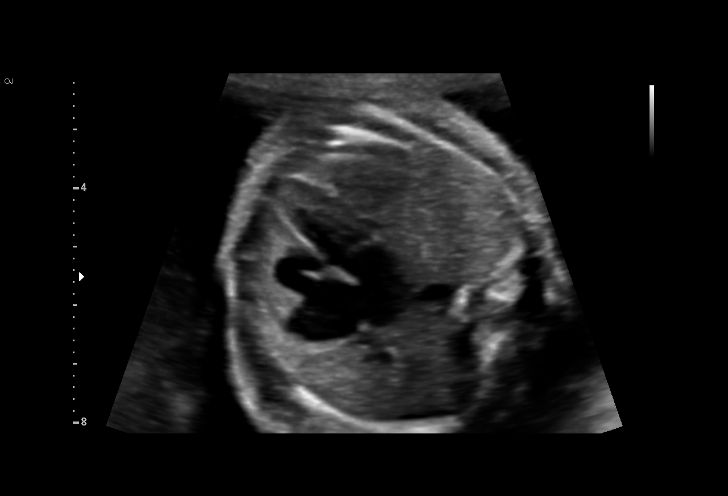
[im 14/41]
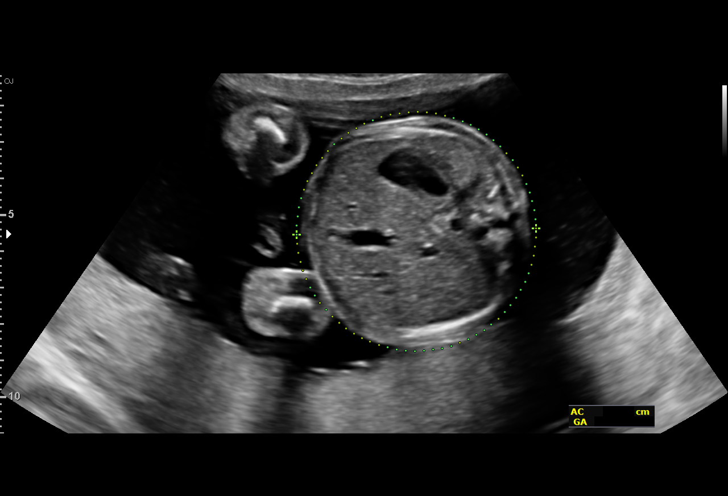
[im 17/41]
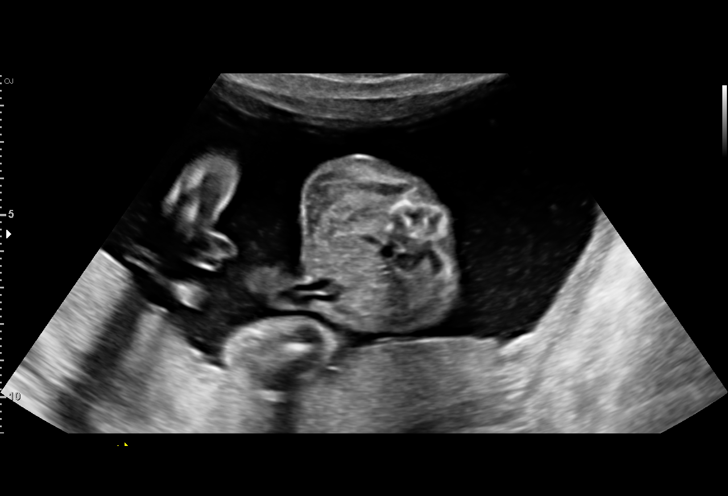
[im 20/41]
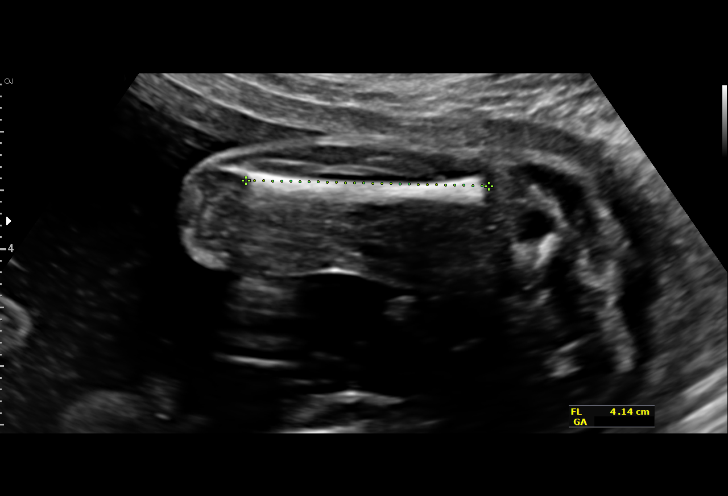
[im 23/41]
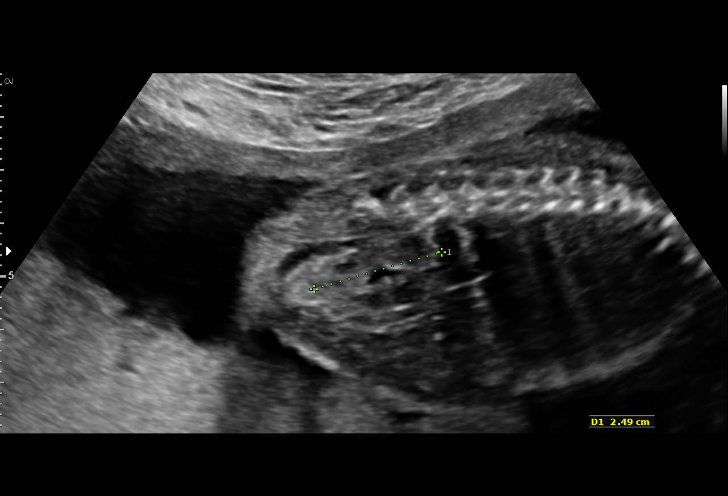
[im 26/41]
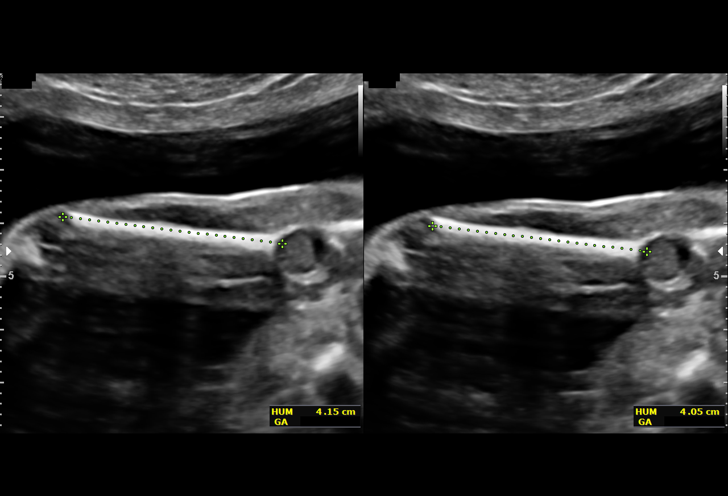
[im 29/41]
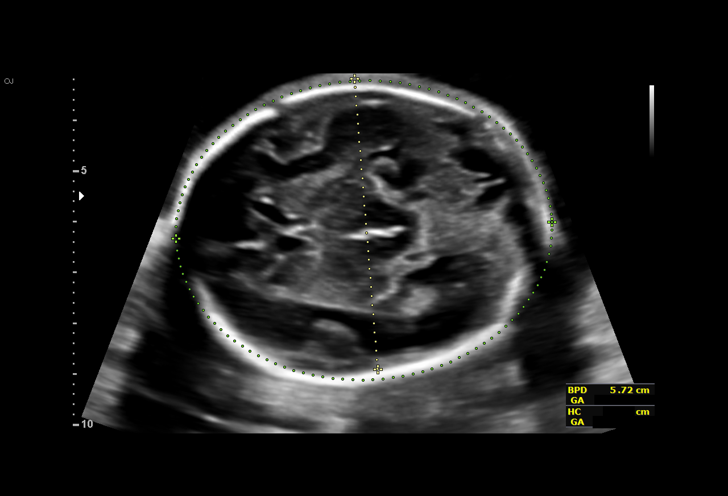
[im 32/41]
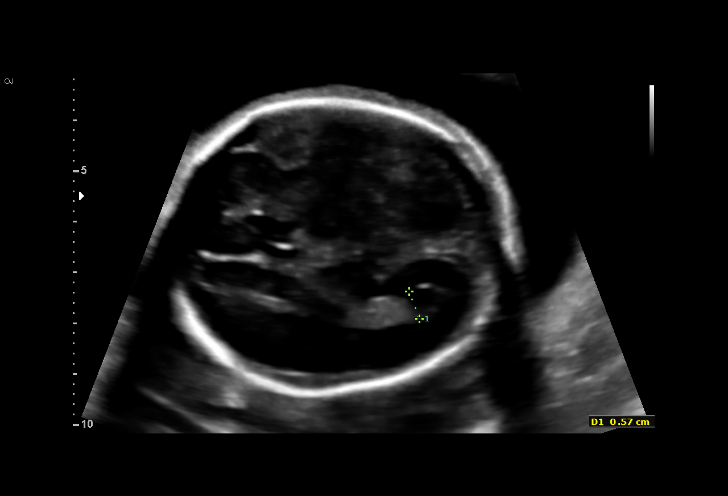
[im 35/41]
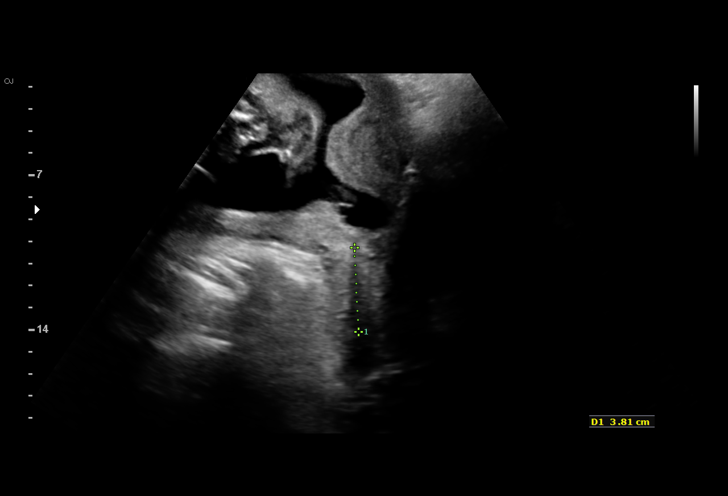
[im 38/41]
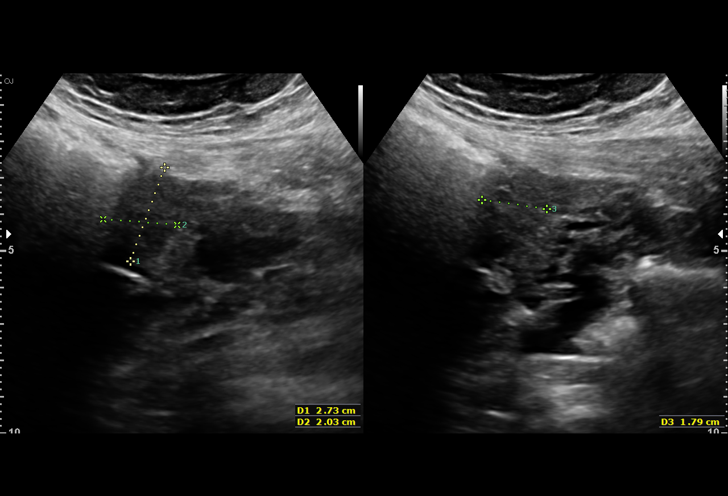
[im 41/41]
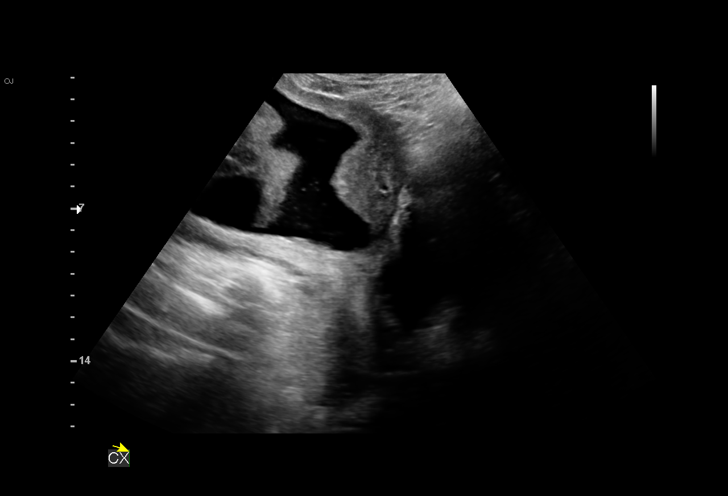

[14 of 28 positions shown; findings below may reference images not displayed]

EDMONDS

BOGER NP

1  GIO M DORANTES            [id number]      [id number]     [id number]
Indications

22 weeks gestation of pregnancy
Gestational diabetes in pregnancy,
unspecified control (A/ B diabetes)
Previous cesarean delivery, antepartum (x 4)
OB History

Blood Type:            Height:  5'3"   Weight (lb):  183      BMI:
Gravidity:    5         Term:   4        Prem:   0        SAB:   0
TOP:          0       Ectopic:  0        Living: 4
Fetal Evaluation

Num Of Fetuses:     1
Fetal Heart         173
Rate(bpm):
Cardiac Activity:   Observed
Presentation:       Transverse, head to maternal left
Placenta:           Posterior, above cervical os
P. Cord Insertion:  Visualized

Amniotic Fluid
AFI FV:      Subjectively within normal limits

Largest Pocket(cm)
5.4
Biometry

BPD:      57.2  mm     G. Age:  23w 3d         84  %    CI:        75.03   %   70 - 86
FL/HC:      19.5   %   18.4 -
HC:      209.5  mm     G. Age:  23w 0d         62  %    HC/AC:      1.01       1.06 -
AC:      208.3  mm     G. Age:  25w 3d       > 97  %    FL/BPD:     71.3   %   71 - 87
FL:       40.8  mm     G. Age:  23w 2d         66  %    FL/AC:      19.6   %   20 - 24
HUM:      41.5  mm     G. Age:  25w 1d       > 95  %
CER:      25.4  mm     G. Age:  23w 3d         65  %

Est. FW:     678  gm      1 lb 8 oz     76  %
Gestational Age

LMP:           23w 6d       Date:   06/19/15                 EDD:   03/25/16
U/S Today:     23w 6d                                        EDD:   03/25/16
Best:          22w 3d    Det. By:   Early Ultrasound         EDD:   04/04/16
(08/15/15)
Anatomy

Cranium:               Appears normal         Aortic Arch:            Previously seen
Cavum:                 Appears normal         Ductal Arch:            Previously seen
Ventricles:            Appears normal         Diaphragm:              Appears normal
Choroid Plexus:        Appears normal         Stomach:                Appears normal, left
sided
Cerebellum:            Appears normal         Abdomen:                Appears normal
Posterior Fossa:       Appears normal         Abdominal Wall:         Appears nml (cord
insert, abd wall)
Nuchal Fold:           Previously seen        Cord Vessels:           Appears normal (3
vessel cord)
Face:                  Orbits and profile     Kidneys:                Appear normal
previously seen
Lips:                  Previously seen        Bladder:                Appears normal
Thoracic:              Appears normal         Spine:                  Previously seen
Heart:                 Appears normal         Upper Extremities:      Previously seen
(4CH, axis, and situs
RVOT:                  Previously seen        Lower Extremities:      Previously seen
LVOT:                  Previously seen

Other:  Fetus appears to be a male. Heels and Right 5th digit prev visualized.
Nasal bone prev visualized. Technically difficult due to maternal
habitus and fetal position.
Cervix Uterus Adnexa

Cervix
Length:            3.8  cm.
Normal appearance by transabdominal scan.

Uterus
No abnormality visualized.

Left Ovary
Within normal limits.

Right Ovary
Within normal limits.
Impression

Single IUP at 22w 3d
A2/B gestational diabetes
Normal interval anatmoy
The estimated fetal weight is at the 76th %tile
Posterior placenta without previa
Normal amniotic fluid volume
Recommendations

Recommend follow-up ultrasound examination in 4 weeks for
growth

## 2017-08-13 IMAGING — US US MFM OB FOLLOW-UP
1 series · 13 of 28 positions shown · non-contrast
Comparison: none

[Series 1: us mfm ob follow-up · 60 acquisitions, 13 frames shown]
[im 3/60]
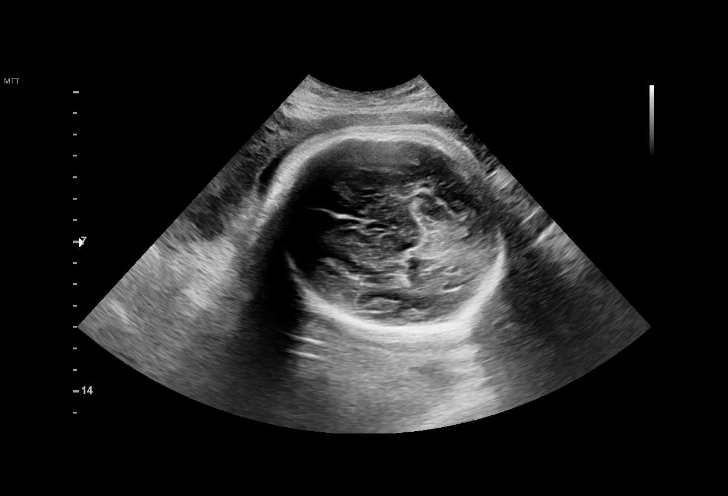
[im 7/60]
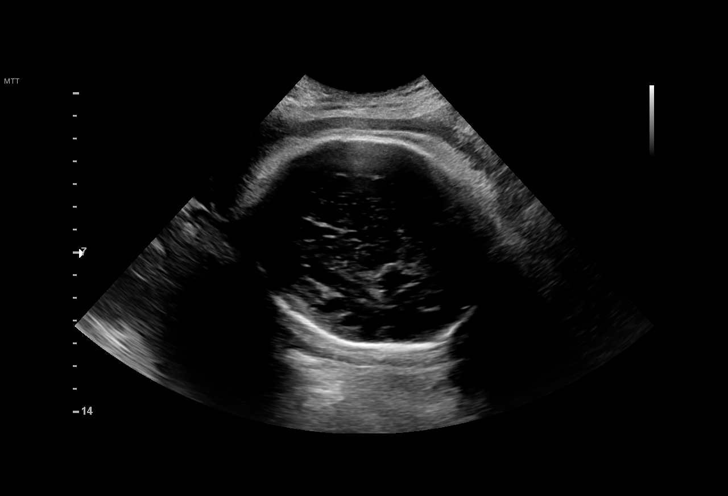
[im 11/60]
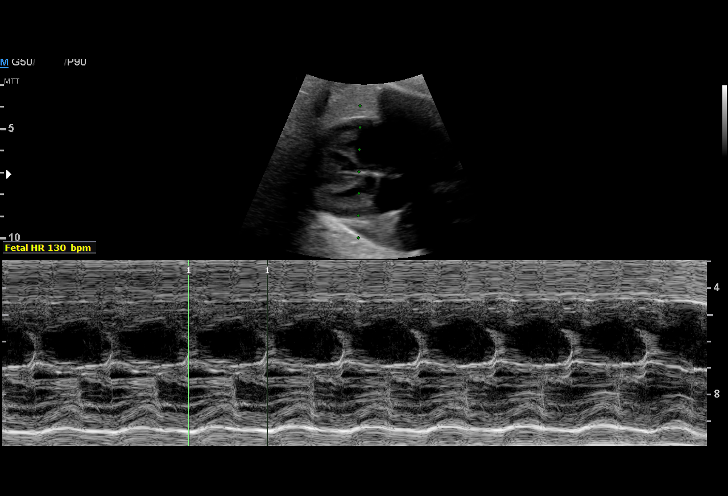
[im 16/60]
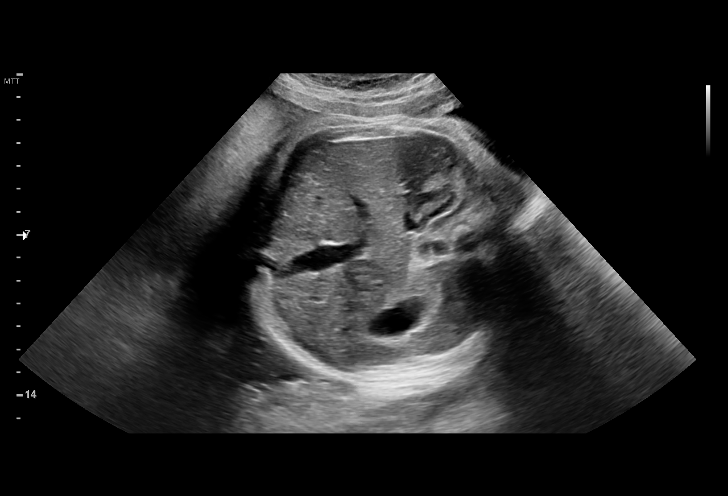
[im 20/60]
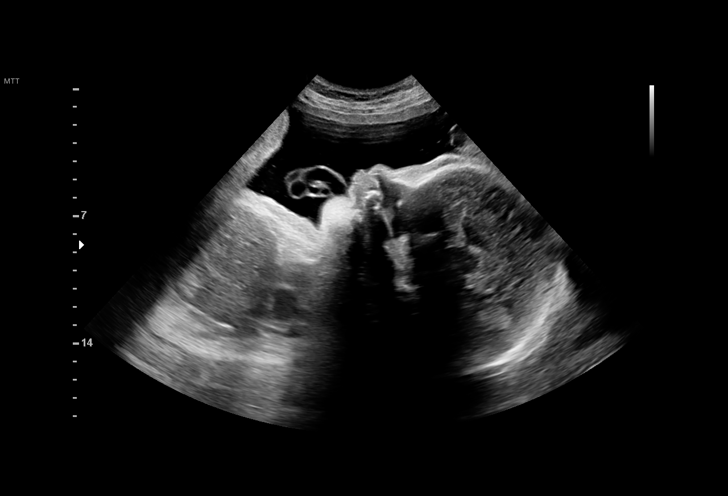
[im 25/60]
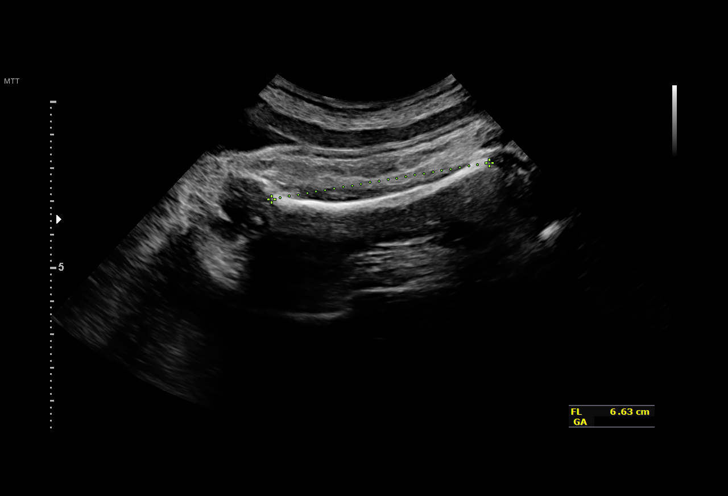
[im 31/60]
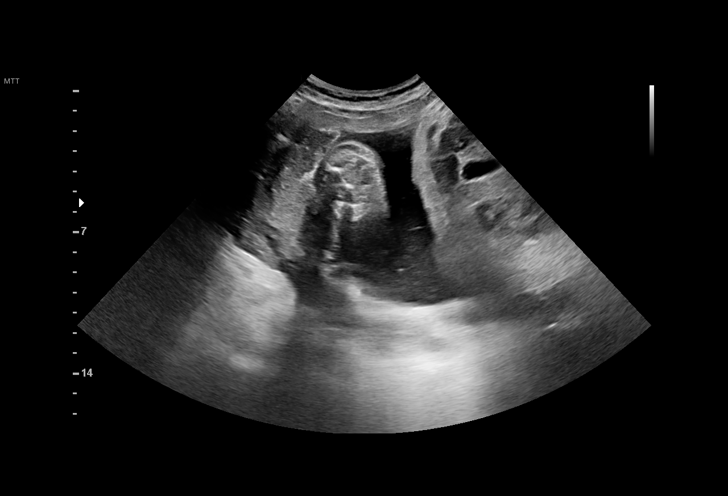
[im 35/60]
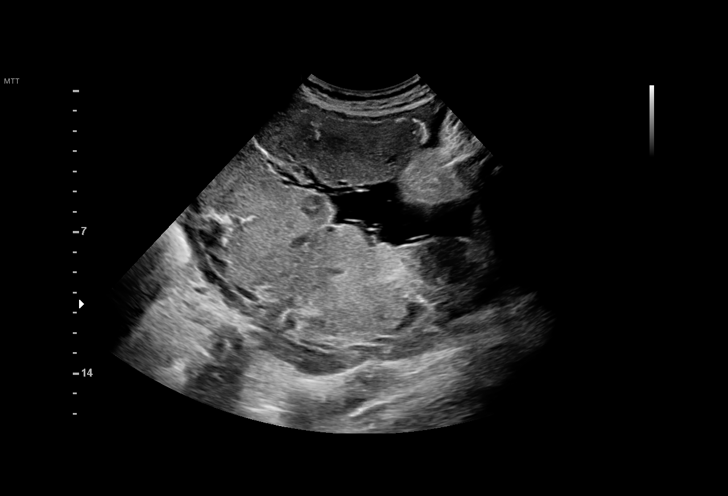
[im 40/60]
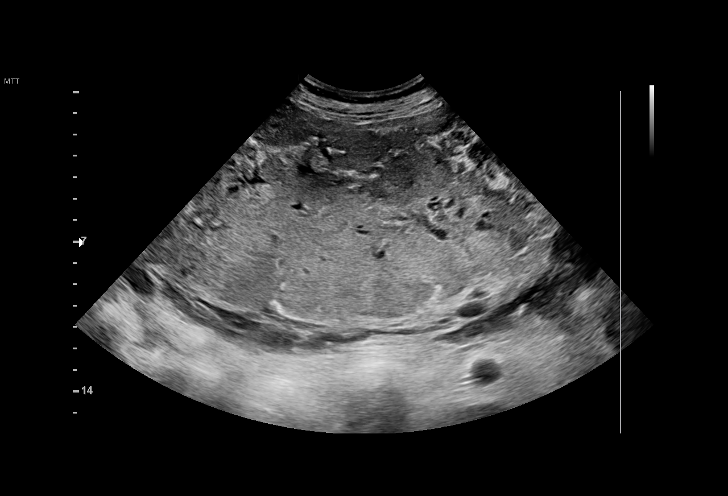
[im 44/60]
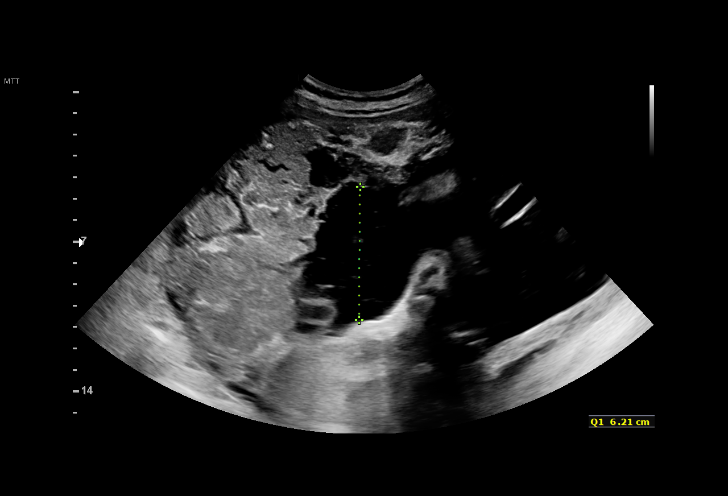
[im 49/60]
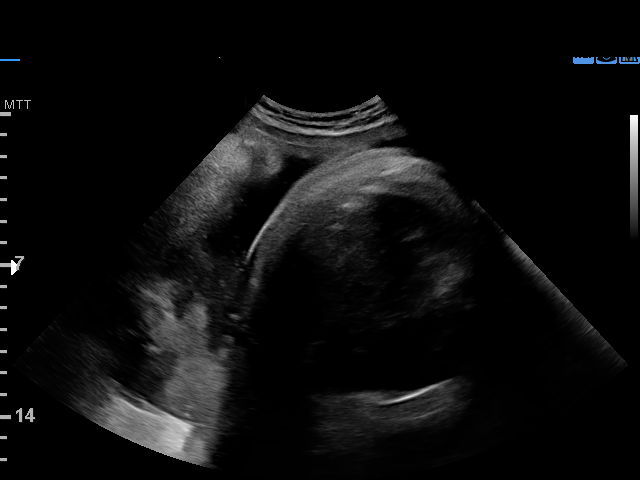
[im 53/60]
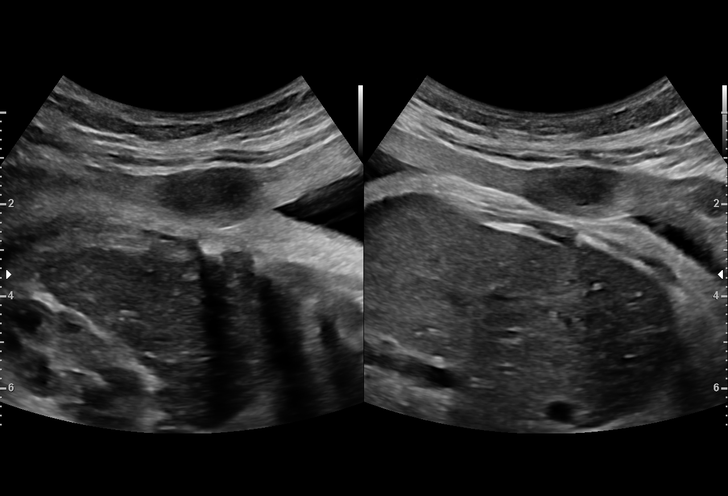
[im 57/60]
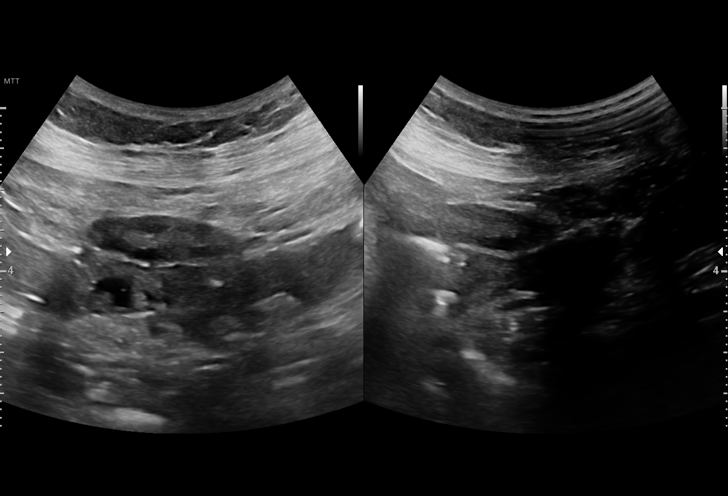

[13 of 28 positions shown; findings below may reference images not displayed]

ROBERTO

TIGER NP

1  JANU LAO              [id number]      [id number]     [id number]
Indications

34 weeks gestation of pregnancy
Gestational diabetes in pregnancy,
unspecified control (A/ B diabetes);
Metformin
Previous cesarean delivery, antepartum (x 4)
OB History

Blood Type:            Height:  5'3"   Weight (lb):  183       BMI:
Gravidity:    5         Term:   4        Prem:   0        SAB:   0
TOP:          0       Ectopic:  0        Living: 4
Fetal Evaluation

Num Of Fetuses:     1
Fetal Heart         130
Rate(bpm):
Cardiac Activity:   Observed
Presentation:       Cephalic
Placenta:           Fundal, above cervical os
P. Cord Insertion:  Previously Visualized

Amniotic Fluid
AFI FV:      Polyhydramnios

AFI Sum(cm)     %Tile       Largest Pocket(cm)
27.9            > 97

RUQ(cm)       RLQ(cm)       LUQ(cm)        LLQ(cm)
6.21
Biometry

BPD:      90.2  mm     G. Age:  36w 4d         93  %    CI:        78.86   %    70 - 86
FL/HC:      20.6   %    20.1 -
HC:      321.2  mm     G. Age:  36w 2d         57  %    HC/AC:      0.80        0.93 -
AC:      401.7  mm     G. Age:  N/A          > 97  %    FL/BPD:     73.4   %    71 - 87
FL:       66.2  mm     G. Age:  34w 1d         29  %    FL/AC:      16.5   %    20 - 24
HUM:        60  mm     G. Age:  34w 6d         66  %

Est. FW:    7887  gm      9 lb 1 oz   > 90  %
Gestational Age

LMP:           36w 0d        Date:  06/19/15                 EDD:   03/25/16
U/S Today:     35w 5d                                        EDD:   03/27/16
Best:          34w 4d     Det. By:  Early Ultrasound         EDD:   04/04/16
(08/15/15)
Anatomy

Cranium:               Appears normal         Aortic Arch:            Previously seen
Cavum:                 Previously seen        Ductal Arch:            Previously seen
Ventricles:            Appears normal         Diaphragm:              Appears normal
Choroid Plexus:        Previously seen        Stomach:                Appears normal, left
sided
Cerebellum:            Previously seen        Abdomen:                Previously seen
Posterior Fossa:       Previously seen        Abdominal Wall:         Previously seen
Nuchal Fold:           Previously seen        Cord Vessels:           Previously seen
Face:                  Orbits and profile     Kidneys:                Appear normal
previously seen
Lips:                  Previously seen        Bladder:                Appears normal
Thoracic:              Previously seen        Spine:                  Previously seen
Heart:                 Previously seen        Upper Extremities:      Previously seen
RVOT:                  Previously seen        Lower Extremities:      Previously seen
LVOT:                  Previously seen

Other:  Male gender previously seen. Heels and Right 5th digit prev
visualized. Nasal bone previously seen.
Cervix Uterus Adnexa

Cervix
Not visualized (advanced GA >76wks)

Uterus
Single fibroid noted, see table below.

Left Ovary
Size(cm)     3.35   x   2.69   x  1.14      Vol(ml):
Within normal limits. No adnexal mass visualized.

Right Ovary
Size(cm)     3.07   x   2.09   x  1.3       Vol(ml):
Within normal limits. No adnexal mass visualized.

Cul De Sac:   No free fluid seen.

Adnexa:       No abnormality visualized.
Myomas

Site                     L(cm)      W(cm)      D(cm)      Location
Anterior Left            2.08       1.95       1          Intramural

Blood Flow                 RI        PI       Comments

Impression

Single IUP at 34w 4d
A2 GDM on glyburide and metformin
The estimated fetal weight is > 90th %tile (7887 g).  The AC
measures > 97th %tile.
Active fetus - fetal breathing noted.
Mild polyhydramnios (AFI 27.9 cm)
Recommendations

Continue 2x weekly NSTs with weekly AFIs
Ultrasound for growth in 3 weeks - plans scheduled repeat C-
section at 39 weeks

## 2019-01-08 ENCOUNTER — Other Ambulatory Visit: Payer: Self-pay

## 2019-01-08 DIAGNOSIS — Z20822 Contact with and (suspected) exposure to covid-19: Secondary | ICD-10-CM

## 2019-01-10 ENCOUNTER — Ambulatory Visit: Payer: Self-pay

## 2019-01-10 LAB — NOVEL CORONAVIRUS, NAA: SARS-CoV-2, NAA: NOT DETECTED

## 2019-01-10 NOTE — Telephone Encounter (Addendum)
Provided  covid -19 lab result voiced understanding.  Interpreter. [place] [phone]

## 2019-01-29 ENCOUNTER — Ambulatory Visit: Payer: Self-pay

## 2019-01-31 ENCOUNTER — Other Ambulatory Visit: Payer: Self-pay

## 2019-01-31 DIAGNOSIS — Z20822 Contact with and (suspected) exposure to covid-19: Secondary | ICD-10-CM

## 2019-02-01 LAB — NOVEL CORONAVIRUS, NAA: SARS-CoV-2, NAA: NOT DETECTED

## 2019-07-07 ENCOUNTER — Ambulatory Visit: Payer: Self-pay

## 2019-07-09 ENCOUNTER — Ambulatory Visit: Payer: Self-pay | Attending: Internal Medicine

## 2019-07-09 DIAGNOSIS — Z23 Encounter for immunization: Secondary | ICD-10-CM

## 2019-07-09 NOTE — Progress Notes (Signed)
   Covid-19 Vaccination Clinic  Name:  Kristina Campbell    MRN: [id number] DOB: 07/29/1985  07/09/2019  Kristina Campbell was observed post Covid-19 immunization for 15 minutes without incident. She was provided with Vaccine Information Sheet and instruction to access the V-Safe system.   Kristina Campbell was instructed to call 911 with any severe reactions post vaccine: Marland Kitchen Difficulty breathing  . Swelling of face and throat  . A fast heartbeat  . A bad rash all over body  . Dizziness and weakness   Immunizations Administered    Name Date Dose VIS Date Route   Pfizer COVID-19 Vaccine 07/09/2019  2:57 PM 0.3 mL 04/18/2018 Intramuscular   Manufacturer: [employer], [employer]   Lot: [id number]   NDC: [address]-1

## 2019-07-30 ENCOUNTER — Ambulatory Visit: Payer: Self-pay | Attending: Internal Medicine

## 2019-09-03 ENCOUNTER — Ambulatory Visit: Payer: Self-pay | Attending: Family Medicine

## 2019-09-03 DIAGNOSIS — Z23 Encounter for immunization: Secondary | ICD-10-CM

## 2019-09-03 NOTE — Progress Notes (Signed)
   Covid-19 Vaccination Clinic  Name:  [name]    MRN: [id number] DOB: 02-16-1986  09/03/2019  [name] was observed post Covid-19 immunization for 15 minutes without incident. She was provided with Vaccine Information Sheet and instruction to access the V-Safe system.   [name] was instructed to call 911 with any severe reactions post vaccine: Marland Kitchen Difficulty breathing  . Swelling of face and throat  . A fast heartbeat  . A bad rash all over body  . Dizziness and weakness   Immunizations Administered    Name Date Dose VIS Date Route   Pfizer COVID-19 Vaccine 09/03/2019  1:56 PM 0.3 mL 04/18/2018 Intramuscular   Manufacturer: [employer], [employer]   Lot: [id number]   NDC: [address]-7

## 2021-07-17 ENCOUNTER — Ambulatory Visit: Payer: Self-pay | Admitting: *Deleted

## 2021-07-17 NOTE — Telephone Encounter (Signed)
Reason for Disposition  Periods with > 6 soaked pads or tampons per day  Answer Assessment - Initial Assessment Questions 1. AMOUNT: "Describe the bleeding that you are having."    - SPOTTING: spotting, or pinkish / brownish mucous discharge; does not fill panty liner or pad    - MILD:  less than 1 pad / hour; less than patient's usual menstrual bleeding   - MODERATE: 1-2 pads / hour; 1 menstrual cup every 6 hours; small-medium blood clots (e.g., pea, grape, small coin)   - SEVERE: soaking 2 or more pads/hour for 2 or more hours; 1 menstrual cup every 2 hours; bleeding not contained by pads or continuous red blood from vagina; large blood clots (e.g., golf ball, large coin)      Wants appt at Primary Care at [facility] for problems with periods. I'm having period every month but it's very heavy.   I want to know why I'm having heavy bleeding.  I have cramps when having period 2. ONSET: "When did the bleeding begin?" "Is it continuing now?"     Maybe 3-5 years I've had this problem.   Does not have a GYN dr.    3. MENSTRUAL PERIOD: "When was the last normal menstrual period?" "How is this different than your period?"     3-5 years ago 4. REGULARITY: "How regular are your periods?"     They are irregular.  Sometimes early or late or twice a month sometimes. 5. ABDOMINAL PAIN: "Do you have any pain?" "How bad is the pain?"  (e.g., Scale 1-10; mild, moderate, or severe)   - MILD (1-3): doesn't interfere with normal activities, abdomen soft and not tender to touch    - MODERATE (4-7): interferes with normal activities or awakens from sleep, abdomen tender to touch    - SEVERE (8-10): excruciating pain, doubled over, unable to do any normal activities      Cramping during period 6. PREGNANCY: "Could you be pregnant?" "Are you sexually active?" "Did you recently give birth?"     No 7. BREASTFEEDING: "Are you breastfeeding?"     No 8. HORMONES: "Are you taking any hormone medications,  prescription or OTC?" (e.g., birth control pills, estrogen)     No 9. BLOOD THINNERS: "Do you take any blood thinners?" (e.g., Coumadin/warfarin, Pradaxa/dabigatran, aspirin)     No 10. CAUSE: "What do you think is causing the bleeding?" (e.g., recent gyn surgery, recent gyn procedure; known bleeding disorder, cervical cancer, polycystic ovarian disease, fibroids)         *No Answer* 11. HEMODYNAMIC STATUS: "Are you weak or feeling lightheaded?" If Yes, ask: "Can you stand and walk normally?"        *No Answer* 12. OTHER SYMPTOMS: "What other symptoms are you having with the bleeding?" (e.g., passed tissue, vaginal discharge, fever, menstrual-type cramps)       *No Answer*  Protocols used: Vaginal Bleeding - Abnormal-A-AH  Chief Complaint: Heavy irregular periods for the last 3-5 years with cramping during periods Symptoms: above Frequency: Last 3-5 years Pertinent Negatives: Patient denies Dizziness Disposition: [] ED /[] Urgent Care (no appt availability in office) / [x] Appointment(In office/virtual)/ []  [place] [facility]/ [] Home Care/ [] Refused Recommended Disposition /[] [place]/ []  Follow-up with PCP Additional Notes: New pt appt made with Dr. at Primary Care at [facility] for 10/21/2021 at 9:00.   Went over s/s to go to the urgent care or ED.   Called in using Spanish interpreter [phone].

## 2021-10-21 ENCOUNTER — Ambulatory Visit: Payer: Self-pay | Admitting: Family Medicine

## 2022-12-08 ENCOUNTER — Ambulatory Visit: Payer: Self-pay

## 2022-12-08 NOTE — Telephone Encounter (Signed)
  Chief Complaint: rectal bleeding Symptoms: occasional dizziness, intermittent right abd pain. Passed one small clot, blood on toilet paper  Frequency: less than a month  Pertinent Negatives: Patient denies water in toilet being pink or red Disposition: [] ED /[] Urgent Care (no appt availability in office) / [] Appointment(In office/virtual)/ []  [place] [facility]/ [] Home Care/ [] Refused Recommended Disposition /[x] [place] [facility]/ []  Follow-up with PCP Additional Notes: assisted with MyChart sign in and pt given address of mobile bus location. Pt has not yet established with PCP. Reason for Disposition  MILD rectal bleeding (more than just a few drops or streaks)  Answer Assessment - Initial Assessment Questions 1. APPEARANCE of BLOOD: "What color is it?" "Is it passed separately, on the surface of the stool, or mixed in with the stool?"       red 2. AMOUNT: "How much blood was passed?"      Blood clots  3. FREQUENCY: "How many times has blood been passed with the stools?"      On toilet paper 4. ONSET: "When was the blood first seen in the stools?" (Days or weeks)       1 month 5. DIARRHEA: "Is there also some diarrhea?" If Yes, ask: "How many diarrhea stools in the past 24 hours?"      no 6. CONSTIPATION: "Do you have constipation?" If Yes, ask: "How bad is it?"     no 9. OTHER SYMPTOMS: "Do you have any other symptoms?"  (e.g., abdomen pain, vomiting, dizziness, fever)     Dizziness Right stomach pain complaint 3/10 comes and goes  Protocols used: Rectal Bleeding-A-AH

## 2023-06-22 NOTE — Progress Notes (Deleted)
 Office Visit Note  Patient: Kristina Campbell             Date of Birth: 1985-05-26           MRN: [id number]             PCP: Fonda Hymen, NP Referring: Fonda Hymen, NP Visit Date: 06/23/2023 Occupation: @GUAROCC @  Subjective:  No chief complaint on file.   History of Present Illness: Kristina Campbell is a 38 y.o. female ***     Activities of Daily Living:  Patient reports morning stiffness for *** {minute/hour:19697}.   Patient {ACTIONS;DENIES/REPORTS:21021675::"Denies"} nocturnal pain.  Difficulty dressing/grooming: {ACTIONS;DENIES/REPORTS:21021675::"Denies"} Difficulty climbing stairs: {ACTIONS;DENIES/REPORTS:21021675::"Denies"} Difficulty getting out of chair: {ACTIONS;DENIES/REPORTS:21021675::"Denies"} Difficulty using hands for taps, buttons, cutlery, and/or writing: {ACTIONS;DENIES/REPORTS:21021675::"Denies"}  No Rheumatology ROS completed.   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 states she has reflux and takes Zantac-last taken yesterday   Gestational diabetes     No family history on file. Past Surgical History:  Procedure Laterality Date   CESAREAN SECTION     CESAREAN SECTION     x3   CESAREAN SECTION N/A 05/05/2012   Procedure: CESAREAN SECTION;  Surgeon: Tresia Fruit, MD;  Location: [facility];  Service: Obstetrics;  Laterality: N/A;  Repeat   CESAREAN SECTION     CESAREAN SECTION WITH BILATERAL TUBAL LIGATION Bilateral 03/30/2016   Procedure: REPEAT CESAREAN SECTION WITH BILATERAL TUBAL LIGATION;  Surgeon: Malka Sea, DO;  [facility];   Service: Obstetrics;  Laterality: Bilateral;   Social History   Social History Narrative   Not on file   Immunization History  Administered Date(s) Administered   Influenza,inj,Quad PF,6+ Mos 11/03/2015   PFIZER(Purple Top)SARS-COV-2 Vaccination 07/09/2019, 09/03/2019   Tdap 05/06/2012, 12/31/2015     Objective: Vital Signs: There were no vitals taken for this visit.   Physical Exam   Musculoskeletal Exam: ***  CDAI Exam: CDAI Score: -- Patient Global: --; Provider Global: -- Swollen: --; Tender: -- Joint Exam 06/23/2023   No joint exam has been documented for this visit   There is currently no information documented on the homunculus. Go to the Rheumatology activity and complete the homunculus joint exam.  Investigation: No additional findings.  Imaging: No results found.  Recent Labs: Lab Results  Component Value Date   WBC 9.5 03/31/2016   HGB 11.4 (L) 03/31/2016   PLT 121 (L) 03/31/2016   NA 134 (L) 03/29/2016   K 3.8 03/29/2016   CL 105 03/29/2016   CO2 19 (L) 03/29/2016   GLUCOSE 125 (H) 04/01/2016   BUN 19 03/29/2016   CREATININE 0.64 03/29/2016   BILITOT 0.3 11/03/2015   ALKPHOS 51 11/03/2015   AST 21 11/03/2015   ALT 16 11/03/2015   PROT 6.6 11/03/2015   ALBUMIN 3.8 11/03/2015   CALCIUM 9.3 03/29/2016   GFRAA >60 03/29/2016    Speciality Comments: No specialty comments available.  Procedures:  No procedures performed Allergies: Patient has no known allergies.   Assessment / Plan:     Visit Diagnoses: No diagnosis found.  Orders: No orders of the defined types were placed in  this encounter.  No orders of the defined types were placed in this encounter.   Face-to-face time spent with patient was *** minutes. Greater than 50% of time was spent in counseling and coordination of care.  Follow-Up Instructions: No follow-ups on file.   Matt Song, MD  Note - This record has been created using [employer].  Chart creation  errors have been sought, but may not always  have been located. Such creation errors do not reflect on  the standard of medical care.

## 2023-06-23 ENCOUNTER — Encounter: Payer: Self-pay | Admitting: Internal Medicine
